# Patient Record
Sex: Female | Born: 1985 | Race: White | Hispanic: No | Marital: Single | Smoking: Current every day smoker
Health system: Southern US, Community
[De-identification: ages and names within clinical notes are randomized; demographics above are authoritative.]

## PROBLEM LIST (undated history)

## (undated) DIAGNOSIS — F909 Attention-deficit hyperactivity disorder, unspecified type: Secondary | ICD-10-CM

## (undated) DIAGNOSIS — G43909 Migraine, unspecified, not intractable, without status migrainosus: Secondary | ICD-10-CM

## (undated) DIAGNOSIS — Z87440 Personal history of urinary (tract) infections: Secondary | ICD-10-CM

## (undated) DIAGNOSIS — D649 Anemia, unspecified: Secondary | ICD-10-CM

## (undated) DIAGNOSIS — M26629 Arthralgia of temporomandibular joint, unspecified side: Secondary | ICD-10-CM

## (undated) DIAGNOSIS — T148XXA Other injury of unspecified body region, initial encounter: Secondary | ICD-10-CM

## (undated) DIAGNOSIS — S51819A Laceration without foreign body of unspecified forearm, initial encounter: Secondary | ICD-10-CM

## (undated) DIAGNOSIS — Z87898 Personal history of other specified conditions: Secondary | ICD-10-CM

## (undated) DIAGNOSIS — S56929A Laceration of unspecified muscles, fascia and tendons at forearm level, unspecified arm, initial encounter: Secondary | ICD-10-CM

## (undated) DIAGNOSIS — A749 Chlamydial infection, unspecified: Secondary | ICD-10-CM

## (undated) DIAGNOSIS — F988 Other specified behavioral and emotional disorders with onset usually occurring in childhood and adolescence: Secondary | ICD-10-CM

## (undated) DIAGNOSIS — R238 Other skin changes: Secondary | ICD-10-CM

## (undated) DIAGNOSIS — R233 Spontaneous ecchymoses: Secondary | ICD-10-CM

---
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
## 2007-09-16 ENCOUNTER — Emergency Department (HOSPITAL_COMMUNITY): Admission: EM | Admit: 2007-09-16 | Discharge: 2007-09-16 | Payer: Self-pay | Admitting: Emergency Medicine

## 2009-01-13 ENCOUNTER — Inpatient Hospital Stay (HOSPITAL_COMMUNITY): Admission: AD | Admit: 2009-01-13 | Discharge: 2009-01-14 | Payer: Self-pay | Admitting: Obstetrics and Gynecology

## 2009-01-23 ENCOUNTER — Inpatient Hospital Stay (HOSPITAL_COMMUNITY): Admission: AD | Admit: 2009-01-23 | Discharge: 2009-01-23 | Payer: Self-pay | Admitting: Obstetrics & Gynecology

## 2009-02-05 ENCOUNTER — Inpatient Hospital Stay (HOSPITAL_COMMUNITY): Admission: AD | Admit: 2009-02-05 | Discharge: 2009-02-09 | Payer: Self-pay | Admitting: Obstetrics and Gynecology

## 2009-06-25 ENCOUNTER — Emergency Department (HOSPITAL_COMMUNITY): Admission: EM | Admit: 2009-06-25 | Discharge: 2009-06-25 | Payer: Self-pay | Admitting: Emergency Medicine

## 2009-11-06 ENCOUNTER — Emergency Department (HOSPITAL_COMMUNITY): Admission: EM | Admit: 2009-11-06 | Discharge: 2009-11-07 | Payer: Self-pay | Admitting: Emergency Medicine

## 2009-12-17 ENCOUNTER — Emergency Department (HOSPITAL_COMMUNITY): Admission: EM | Admit: 2009-12-17 | Discharge: 2009-12-18 | Payer: Self-pay | Admitting: Emergency Medicine

## 2010-04-28 ENCOUNTER — Encounter: Payer: Self-pay | Admitting: Obstetrics and Gynecology

## 2010-05-24 ENCOUNTER — Emergency Department (INDEPENDENT_AMBULATORY_CARE_PROVIDER_SITE_OTHER): Payer: No Typology Code available for payment source

## 2010-05-24 ENCOUNTER — Emergency Department (HOSPITAL_BASED_OUTPATIENT_CLINIC_OR_DEPARTMENT_OTHER)
Admission: EM | Admit: 2010-05-24 | Discharge: 2010-05-24 | Disposition: A | Discharge status: Home / Self Care | Payer: No Typology Code available for payment source | Attending: Emergency Medicine | Admitting: Emergency Medicine

## 2010-05-24 DIAGNOSIS — M542 Cervicalgia: Secondary | ICD-10-CM

## 2010-05-24 DIAGNOSIS — S139XXA Sprain of joints and ligaments of unspecified parts of neck, initial encounter: Secondary | ICD-10-CM | POA: Insufficient documentation

## 2010-05-24 DIAGNOSIS — M549 Dorsalgia, unspecified: Secondary | ICD-10-CM

## 2010-05-24 DIAGNOSIS — Y92410 Unspecified street and highway as the place of occurrence of the external cause: Secondary | ICD-10-CM | POA: Insufficient documentation

## 2010-05-24 DIAGNOSIS — F17200 Nicotine dependence, unspecified, uncomplicated: Secondary | ICD-10-CM | POA: Insufficient documentation

## 2010-05-27 ENCOUNTER — Emergency Department (HOSPITAL_BASED_OUTPATIENT_CLINIC_OR_DEPARTMENT_OTHER)
Admission: EM | Admit: 2010-05-27 | Discharge: 2010-05-27 | Disposition: A | Discharge status: Home / Self Care | Payer: No Typology Code available for payment source | Attending: Emergency Medicine | Admitting: Emergency Medicine

## 2010-05-27 DIAGNOSIS — Y92410 Unspecified street and highway as the place of occurrence of the external cause: Secondary | ICD-10-CM | POA: Insufficient documentation

## 2010-05-27 DIAGNOSIS — M549 Dorsalgia, unspecified: Secondary | ICD-10-CM | POA: Insufficient documentation

## 2010-06-18 ENCOUNTER — Emergency Department (HOSPITAL_BASED_OUTPATIENT_CLINIC_OR_DEPARTMENT_OTHER)
Admission: EM | Admit: 2010-06-18 | Discharge: 2010-06-18 | Disposition: A | Discharge status: Home / Self Care | Payer: No Typology Code available for payment source | Attending: Emergency Medicine | Admitting: Emergency Medicine

## 2010-06-18 DIAGNOSIS — M542 Cervicalgia: Secondary | ICD-10-CM | POA: Insufficient documentation

## 2010-06-18 DIAGNOSIS — M549 Dorsalgia, unspecified: Secondary | ICD-10-CM | POA: Insufficient documentation

## 2010-06-18 DIAGNOSIS — F17200 Nicotine dependence, unspecified, uncomplicated: Secondary | ICD-10-CM | POA: Insufficient documentation

## 2010-06-20 LAB — URINALYSIS, ROUTINE W REFLEX MICROSCOPIC
Ketones, ur: NEGATIVE mg/dL
Nitrite: NEGATIVE
Protein, ur: NEGATIVE mg/dL

## 2010-06-20 LAB — POCT I-STAT, CHEM 8
Calcium, Ion: 1.19 mmol/L (ref 1.12–1.32)
HCT: 46 % (ref 36.0–46.0)
Hemoglobin: 15.6 g/dL — ABNORMAL HIGH (ref 12.0–15.0)
TCO2: 29 mmol/L (ref 0–100)

## 2010-06-21 LAB — URINALYSIS, ROUTINE W REFLEX MICROSCOPIC
Bilirubin Urine: NEGATIVE
Hgb urine dipstick: NEGATIVE
Protein, ur: NEGATIVE mg/dL
Specific Gravity, Urine: 1.013 (ref 1.005–1.030)
Urobilinogen, UA: 0.2 mg/dL (ref 0.0–1.0)

## 2010-06-21 LAB — POCT I-STAT, CHEM 8
Creatinine, Ser: 0.7 mg/dL (ref 0.4–1.2)
Glucose, Bld: 96 mg/dL (ref 70–99)
Hemoglobin: 11.9 g/dL — ABNORMAL LOW (ref 12.0–15.0)
Potassium: 3.9 mEq/L (ref 3.5–5.1)

## 2010-07-10 LAB — CBC
HCT: 27.7 % — ABNORMAL LOW (ref 36.0–46.0)
MCHC: 33.9 g/dL (ref 30.0–36.0)
MCHC: 34 g/dL (ref 30.0–36.0)
MCV: 92.3 fL (ref 78.0–100.0)
MCV: 93.5 fL (ref 78.0–100.0)
Platelets: 224 10*3/uL (ref 150–400)
RBC: 3.69 MIL/uL — ABNORMAL LOW (ref 3.87–5.11)
RDW: 13.6 % (ref 11.5–15.5)

## 2010-07-10 LAB — RPR: RPR Ser Ql: NONREACTIVE

## 2010-08-08 ENCOUNTER — Emergency Department (HOSPITAL_COMMUNITY)
Admission: EM | Admit: 2010-08-08 | Discharge: 2010-08-08 | Disposition: A | Discharge status: Home / Self Care | Payer: Medicaid Other | Attending: Emergency Medicine | Admitting: Emergency Medicine

## 2010-08-08 ENCOUNTER — Emergency Department (HOSPITAL_COMMUNITY): Payer: Medicaid Other

## 2010-08-08 DIAGNOSIS — M545 Low back pain, unspecified: Secondary | ICD-10-CM | POA: Insufficient documentation

## 2010-08-08 DIAGNOSIS — R000 Tachycardia, unspecified: Secondary | ICD-10-CM | POA: Insufficient documentation

## 2010-08-08 DIAGNOSIS — M546 Pain in thoracic spine: Secondary | ICD-10-CM | POA: Insufficient documentation

## 2010-08-08 DIAGNOSIS — S8010XA Contusion of unspecified lower leg, initial encounter: Secondary | ICD-10-CM | POA: Insufficient documentation

## 2010-08-08 DIAGNOSIS — M542 Cervicalgia: Secondary | ICD-10-CM | POA: Insufficient documentation

## 2010-08-08 DIAGNOSIS — IMO0002 Reserved for concepts with insufficient information to code with codable children: Secondary | ICD-10-CM | POA: Insufficient documentation

## 2010-08-11 ENCOUNTER — Inpatient Hospital Stay (INDEPENDENT_AMBULATORY_CARE_PROVIDER_SITE_OTHER)
Admission: RE | Admit: 2010-08-11 | Discharge: 2010-08-11 | Disposition: A | Discharge status: Home / Self Care | Payer: No Typology Code available for payment source | Source: Ambulatory Visit | Attending: Family Medicine | Admitting: Family Medicine

## 2010-08-11 DIAGNOSIS — IMO0001 Reserved for inherently not codable concepts without codable children: Secondary | ICD-10-CM

## 2012-12-31 ENCOUNTER — Encounter (HOSPITAL_BASED_OUTPATIENT_CLINIC_OR_DEPARTMENT_OTHER): Payer: Self-pay | Admitting: *Deleted

## 2012-12-31 ENCOUNTER — Emergency Department (HOSPITAL_BASED_OUTPATIENT_CLINIC_OR_DEPARTMENT_OTHER)
Admission: EM | Admit: 2012-12-31 | Discharge: 2013-01-01 | Disposition: A | Discharge status: Home / Self Care | Payer: Medicaid Other | Attending: Emergency Medicine | Admitting: Emergency Medicine

## 2012-12-31 DIAGNOSIS — Z79899 Other long term (current) drug therapy: Secondary | ICD-10-CM | POA: Insufficient documentation

## 2012-12-31 DIAGNOSIS — Z3202 Encounter for pregnancy test, result negative: Secondary | ICD-10-CM | POA: Insufficient documentation

## 2012-12-31 DIAGNOSIS — F17200 Nicotine dependence, unspecified, uncomplicated: Secondary | ICD-10-CM | POA: Insufficient documentation

## 2012-12-31 DIAGNOSIS — N12 Tubulo-interstitial nephritis, not specified as acute or chronic: Secondary | ICD-10-CM | POA: Insufficient documentation

## 2012-12-31 DIAGNOSIS — R109 Unspecified abdominal pain: Secondary | ICD-10-CM | POA: Insufficient documentation

## 2012-12-31 LAB — URINALYSIS, ROUTINE W REFLEX MICROSCOPIC
Ketones, ur: 40 mg/dL — AB
Protein, ur: 100 mg/dL — AB
Urobilinogen, UA: 1 mg/dL (ref 0.0–1.0)

## 2012-12-31 LAB — WET PREP, GENITAL

## 2012-12-31 MED ORDER — PHENAZOPYRIDINE HCL 100 MG PO TABS
95.0000 mg | ORAL_TABLET | Freq: Once | ORAL | Status: AC
  Administered 2012-12-31: 100 mg via ORAL
  Filled 2012-12-31: qty 1

## 2012-12-31 MED ORDER — OXYCODONE-ACETAMINOPHEN 5-325 MG PO TABS
1.0000 | ORAL_TABLET | Freq: Once | ORAL | Status: AC
  Administered 2012-12-31: 1 via ORAL
  Filled 2012-12-31: qty 1

## 2012-12-31 NOTE — ED Provider Notes (Signed)
CSN: [id number]     Arrival date & time 12/31/12  2215 History   First MD Initiated Contact with Patient 12/31/12 2310     Chief Complaint  Patient presents with  . Dysuria   (Consider location/radiation/quality/duration/timing/severity/associated sxs/prior Treatment) Patient is a 27 y.o. female presenting with abdominal pain.  Abdominal Pain Pain location:  Suprapubic Pain quality: pressure   Pain radiates to:  Does not radiate Pain severity:  Severe Onset quality:  Gradual Duration:  3 days Timing:  Constant Progression:  Worsening Chronicity:  New Context comment:  Evaluated at urgent care and treated with Macrobid, Pyridium for urinary tract infection. Due to continued pain, was started on Keflex today. Relieved by:  Nothing Ineffective treatments: pyridium, Macrobid,  2 ddoses of Keflex. Associated symptoms: dysuria, hematuria and vaginal discharge   Associated symptoms: no chest pain, no constipation, no cough, no diarrhea, no fever, no nausea, no shortness of breath, no vaginal bleeding and no vomiting     History reviewed. No pertinent past medical history. History reviewed. No pertinent past surgical history. No family history on file. History  Substance Use Topics  . Smoking status: Current Every Day Smoker  . Smokeless tobacco: Not on file  . Alcohol Use: No   OB History   Grav Para Term Preterm Abortions TAB SAB Ect Mult Living                 Review of Systems  Constitutional: Negative for fever.  HENT: Negative for congestion.   Respiratory: Negative for cough and shortness of breath.   Cardiovascular: Negative for chest pain.  Gastrointestinal: Positive for abdominal pain. Negative for nausea, vomiting, diarrhea and constipation.  Genitourinary: Positive for dysuria, hematuria and vaginal discharge. Negative for vaginal bleeding and menstrual problem (last menstrual period last week).  All other systems reviewed and are negative.    Allergies  Sulfa  antibiotics  Home Medications   Current Outpatient Rx  Name  Route  Sig  Dispense  Refill  . cephALEXin (KEFLEX) 500 MG capsule   Oral   Take 500 mg by mouth 4 (four) times daily.         Marland Kitchen escitalopram (LEXAPRO) 20 MG tablet   Oral   Take 20 mg by mouth daily.          BP 100/76  Pulse 103  Temp(Src) 98.9 F (37.2 C) (Oral)  Resp 18  Ht 5\' 6"  (1.676 m)  Wt 128 lb (58.06 kg)  BMI 20.67 kg/m2  SpO2 100%  LMP 12/24/2012 Physical Exam  Nursing note and vitals reviewed. Constitutional: She is oriented to person, place, and time. She appears well-developed and well-nourished. No distress.  HENT:  Head: Normocephalic and atraumatic.  Mouth/Throat: Oropharynx is clear and moist.  Eyes: Conjunctivae are normal. Pupils are equal, round, and reactive to light. No scleral icterus.  Neck: Neck supple.  Cardiovascular: Normal rate, regular rhythm, normal heart sounds and intact distal pulses.   No murmur heard. Pulmonary/Chest: Effort normal and breath sounds normal. No stridor. No respiratory distress. She has no rales.  Abdominal: Soft. Bowel sounds are normal. She exhibits no distension. There is tenderness in the right lower quadrant, suprapubic area and left lower quadrant. There is guarding. There is no rigidity, no rebound and no CVA tenderness.  Genitourinary:    There is no tenderness on the right labia. There is no tenderness on the left labia. Uterus is tender. Cervix exhibits motion tenderness and discharge. Cervix exhibits no friability. Right  adnexum displays tenderness. Right adnexum displays no mass. Left adnexum displays tenderness. Left adnexum displays no mass. Vaginal discharge (Large amount of thick, curdy-like, dark gray) found.  Nonfocal, severe pelvic tenderness  Musculoskeletal: Normal range of motion.  Neurological: She is alert and oriented to person, place, and time.  Skin: Skin is warm and dry. No rash noted.  Psychiatric: She has a normal mood and  affect. Her behavior is normal.    ED Course  Procedures (including critical care time) Labs Review Labs Reviewed  WET PREP, GENITAL - Abnormal; Notable for the following:    Clue Cells Wet Prep HPF POC FEW (*)    WBC, Wet Prep HPF POC FEW (*)    All other components within normal limits  URINALYSIS, ROUTINE W REFLEX MICROSCOPIC - Abnormal; Notable for the following:    Color, Urine RED (*)    APPearance TURBID (*)    Hgb urine dipstick LARGE (*)    Bilirubin Urine MODERATE (*)    Ketones, ur 40 (*)    Protein, ur 100 (*)    Nitrite POSITIVE (*)    Leukocytes, UA LARGE (*)    All other components within normal limits  URINE MICROSCOPIC-ADD ON - Abnormal; Notable for the following:    Squamous Epithelial / LPF FEW (*)    Bacteria, UA FEW (*)    All other components within normal limits  COMPREHENSIVE METABOLIC PANEL - Abnormal; Notable for the following:    Glucose, Bld 100 (*)    GFR calc non Af Amer 78 (*)    GFR calc Af Amer 90 (*)    All other components within normal limits  URINE CULTURE  GC/CHLAMYDIA PROBE AMP  PREGNANCY, URINE  CBC WITH DIFFERENTIAL  LIPASE, BLOOD  CG4 I-STAT (LACTIC ACID)   Imaging Review Ct Abdomen Pelvis W Contrast  01/01/2013   CLINICAL DATA:  Generalized abdominal and low back pain. Dysuria for 3 days.  EXAM: CT ABDOMEN AND PELVIS WITH CONTRAST  TECHNIQUE: Multidetector CT imaging of the abdomen and pelvis was performed using the standard protocol following bolus administration of intravenous contrast.  CONTRAST:  OMNIPAQUE IOHEXOL 300 MG/ML  SOLN  COMPARISON:  None.  FINDINGS: Lung bases: Clear lung bases. Normal heart size without pericardial or pleural effusion.  Abdomen/Pelvis: Normal liver, spleen, stomach, pancreas, gallbladder, biliary tract, adrenal glands. Suspect ill definition of cortico-medullary differentiation within both kidneys. Example image 35/series 2. No retroperitoneal or retrocrural adenopathy. Colonic stool burden  suggests constipation. Normal terminal ileum and appendix. Normal small bowel without abdominal ascites. No pelvic adenopathy. Normal urinary bladder and uterus, without adnexal mass or significant free pelvic fluid.  Soft tissue fullness about the right side of the perineum/labia. 3.3 x 3.9 cm on image 88/series 2  Bones/Musculoskeletal: No acute osseous abnormality.  IMPRESSION: 1.  Possible constipation. 2. 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 Cannot exclude pyelonephritis. Correlate with urinalysis. 3. Soft tissue fullness within the right side of the labia/perineum. Indeterminate. Recommend PHYSICAL EXAMINATION correlation. Considerations include Bartholin's gland cyst.   Electronically Signed   By: Jeronimo Greaves   On: 01/01/2013 02:52  All radiology studies independently viewed by me.     MDM   1. Pyelonephritis    27 year old female presenting with dysuria and lower abdominal pain. Her urinalysis was consistent with urinary tract infection. However, her pain and abdominal tenderness was felt to be out of proportion to simple cystitis. Pelvic exam showed large amount of gray-black discharge and diffuse pelvic  tenderness. CT was obtained and showed no evidence of ovarian or uterine pathology. It did show signs of pyelonephritis and what was felt to be a Bartholin's gland cyst. She had 3 days worth of symptoms, no leukocytosis, no lactic acidosis. Ovarian torsion and TOA were considered, but given the rest of her clinical picture they were felt to be very unlikely, especially with duration of symptoms and negative CT and labs.    As regards her pyelonephritis, she was given IV Rocephin.  Her blood pressures were somewhat low. However, she was not tachycardic or lightheaded. Even so, admission was advised. She adamantly refused this. She was given strict return precautions (including fevers, worsening pain, inability to tolerate by mouth's, or any other concerning symptoms) and  verbalized her understanding. She will be treated with 2 weeks of by mouth Keflex. She already has 10 days worth, so she was provided with 4 additional days worth. Multiple doses of by mouth and IV narcotics have improved pain somewhat, but not totally.  As regards her pelvic pain and vaginal discharge, she will be treated for PID. She already received a dose of Rocephin. She will be discharged with two-week course of doxycycline. She will followup closely with her gynecologist.  As regards her Bartholin's gland cyst, she does not show physical exam evidence of abscess. She reports intermittent symptoms, but has not talked to her gynecologist about these. Advised to discuss with her gynecologist.    Candyce Churn, MD 01/01/13 [phone]

## 2012-12-31 NOTE — ED Notes (Signed)
MD at bedside. 

## 2012-12-31 NOTE — ED Notes (Addendum)
Pt is being treated for a UTI. She sts that she was an antibiotic "that began with an M" but it wasn't helping so she was changed to Keflex today. Pt sts that she is having severe lower back pain and is unable to urinate. Her PMD did not perform a pelvic exam.

## 2012-12-31 NOTE — ED Notes (Signed)
Pt reports taking pyridium and it not helping.

## 2013-01-01 ENCOUNTER — Emergency Department (HOSPITAL_BASED_OUTPATIENT_CLINIC_OR_DEPARTMENT_OTHER): Payer: Medicaid Other

## 2013-01-01 LAB — CBC WITH DIFFERENTIAL/PLATELET
Basophils Absolute: 0 10*3/uL (ref 0.0–0.1)
Eosinophils Absolute: 0 10*3/uL (ref 0.0–0.7)
Eosinophils Relative: 0 % (ref 0–5)
HCT: 38.6 % (ref 36.0–46.0)
Lymphocytes Relative: 14 % (ref 12–46)
Lymphs Abs: 1.3 10*3/uL (ref 0.7–4.0)
MCH: 30.5 pg (ref 26.0–34.0)
MCV: 92.8 fL (ref 78.0–100.0)
Monocytes Absolute: 0.7 10*3/uL (ref 0.1–1.0)
Monocytes Relative: 8 % (ref 3–12)
Platelets: 295 10*3/uL (ref 150–400)
RBC: 4.16 MIL/uL (ref 3.87–5.11)
RDW: 12.3 % (ref 11.5–15.5)

## 2013-01-01 LAB — COMPREHENSIVE METABOLIC PANEL
ALT: 13 U/L (ref 0–35)
Calcium: 10.1 mg/dL (ref 8.4–10.5)
Creatinine, Ser: 1 mg/dL (ref 0.50–1.10)
GFR calc Af Amer: 90 mL/min — ABNORMAL LOW (ref >90)
Glucose, Bld: 100 mg/dL — ABNORMAL HIGH (ref 70–99)
Sodium: 138 mEq/L (ref 135–145)
Total Protein: 7.3 g/dL (ref 6.0–8.3)

## 2013-01-01 LAB — CG4 I-STAT (LACTIC ACID): Lactic Acid, Venous: 0.84 mmol/L (ref 0.5–2.2)

## 2013-01-01 MED ORDER — IOHEXOL 300 MG/ML  SOLN
100.0000 mL | Freq: Once | INTRAMUSCULAR | Status: AC | PRN
  Administered 2013-01-01: 100 mL via INTRAVENOUS

## 2013-01-01 MED ORDER — CEPHALEXIN 500 MG PO CAPS: 500.0000 mg | ORAL_CAPSULE | Freq: Four times a day (QID) | ORAL | Status: DC

## 2013-01-01 MED ORDER — SODIUM CHLORIDE 0.9 % IV BOLUS (SEPSIS)
1000.0000 mL | Freq: Once | INTRAVENOUS | Status: AC
  Administered 2013-01-01: 1000 mL via INTRAVENOUS

## 2013-01-01 MED ORDER — DEXTROSE 5 % IV SOLN
1.0000 g | Freq: Once | INTRAVENOUS | Status: AC
  Administered 2013-01-01: 1 g via INTRAVENOUS

## 2013-01-01 MED ORDER — MORPHINE SULFATE 4 MG/ML IJ SOLN
4.0000 mg | Freq: Once | INTRAMUSCULAR | Status: AC
  Administered 2013-01-01: 4 mg via INTRAVENOUS

## 2013-01-01 MED ORDER — ONDANSETRON HCL 4 MG/2ML IJ SOLN
4.0000 mg | Freq: Once | INTRAMUSCULAR | Status: AC
  Administered 2013-01-01: 4 mg via INTRAVENOUS
  Filled 2013-01-01: qty 2

## 2013-01-01 MED ORDER — CEFTRIAXONE SODIUM 1 G IJ SOLR
INTRAMUSCULAR | Status: AC
  Filled 2013-01-01: qty 10

## 2013-01-01 MED ORDER — ONDANSETRON HCL 4 MG PO TABS: 4.0000 mg | ORAL_TABLET | Freq: Four times a day (QID) | ORAL | Status: DC

## 2013-01-01 MED ORDER — OXYCODONE-ACETAMINOPHEN 5-325 MG PO TABS
1.0000 | ORAL_TABLET | Freq: Once | ORAL | Status: AC
  Administered 2013-01-01: 1 via ORAL
  Filled 2013-01-01: qty 1

## 2013-01-01 MED ORDER — FENTANYL CITRATE 0.05 MG/ML IJ SOLN
50.0000 ug | Freq: Once | INTRAMUSCULAR | Status: AC
  Administered 2013-01-01: 50 ug via INTRAVENOUS
  Filled 2013-01-01: qty 2

## 2013-01-01 MED ORDER — DOXYCYCLINE HYCLATE 100 MG PO TABS
100.0000 mg | ORAL_TABLET | Freq: Once | ORAL | Status: AC
  Administered 2013-01-01: 100 mg via ORAL
  Filled 2013-01-01: qty 1

## 2013-01-01 MED ORDER — IOHEXOL 300 MG/ML  SOLN
50.0000 mL | Freq: Once | INTRAMUSCULAR | Status: AC | PRN
  Administered 2013-01-01: 50 mL via ORAL

## 2013-01-01 MED ORDER — MORPHINE SULFATE 4 MG/ML IJ SOLN
INTRAMUSCULAR | Status: AC
  Filled 2013-01-01: qty 1

## 2013-01-01 MED ORDER — OXYCODONE-ACETAMINOPHEN 5-325 MG PO TABS: 1.0000 | ORAL_TABLET | ORAL | Status: DC | PRN

## 2013-01-01 MED ORDER — MORPHINE SULFATE 4 MG/ML IJ SOLN
4.0000 mg | Freq: Once | INTRAMUSCULAR | Status: DC
  Filled 2013-01-01: qty 1

## 2013-01-01 MED ORDER — DOXYCYCLINE HYCLATE 100 MG PO CAPS: 100.0000 mg | ORAL_CAPSULE | Freq: Two times a day (BID) | ORAL | Status: DC

## 2013-01-01 NOTE — ED Notes (Signed)
Patient hit call light, notified me that she consumed all the contrast she was given.

## 2013-01-01 NOTE — ED Notes (Signed)
Pt requesting going home.  MD aware.

## 2013-01-01 NOTE — ED Notes (Signed)
Pt requesting more pain medication. MD made aware

## 2013-01-01 NOTE — ED Notes (Signed)
MD went to speak with patient.  Pt is refusing admission.  MD verbalized risk of not being admitted and signs and symptoms to look for to immediately come back.  Pt verbalized understanding.

## 2013-01-01 NOTE — ED Notes (Signed)
Friend at bedside speaking with patient.

## 2013-01-02 ENCOUNTER — Encounter (HOSPITAL_BASED_OUTPATIENT_CLINIC_OR_DEPARTMENT_OTHER): Payer: Self-pay | Admitting: *Deleted

## 2013-01-02 ENCOUNTER — Emergency Department (HOSPITAL_BASED_OUTPATIENT_CLINIC_OR_DEPARTMENT_OTHER)
Admission: EM | Admit: 2013-01-02 | Discharge: 2013-01-02 | Disposition: A | Discharge status: Home / Self Care | Payer: Medicaid Other | Attending: Emergency Medicine | Admitting: Emergency Medicine

## 2013-01-02 DIAGNOSIS — R630 Anorexia: Secondary | ICD-10-CM | POA: Insufficient documentation

## 2013-01-02 DIAGNOSIS — R110 Nausea: Secondary | ICD-10-CM | POA: Insufficient documentation

## 2013-01-02 DIAGNOSIS — R509 Fever, unspecified: Secondary | ICD-10-CM | POA: Insufficient documentation

## 2013-01-02 DIAGNOSIS — Z792 Long term (current) use of antibiotics: Secondary | ICD-10-CM | POA: Insufficient documentation

## 2013-01-02 DIAGNOSIS — Z79899 Other long term (current) drug therapy: Secondary | ICD-10-CM | POA: Insufficient documentation

## 2013-01-02 DIAGNOSIS — N12 Tubulo-interstitial nephritis, not specified as acute or chronic: Secondary | ICD-10-CM | POA: Insufficient documentation

## 2013-01-02 DIAGNOSIS — Z3202 Encounter for pregnancy test, result negative: Secondary | ICD-10-CM | POA: Insufficient documentation

## 2013-01-02 LAB — URINALYSIS, ROUTINE W REFLEX MICROSCOPIC
Glucose, UA: NEGATIVE mg/dL
Leukocytes, UA: NEGATIVE
Nitrite: NEGATIVE
Specific Gravity, Urine: 1.012 (ref 1.005–1.030)
pH: 7 (ref 5.0–8.0)

## 2013-01-02 LAB — PREGNANCY, URINE: Preg Test, Ur: NEGATIVE

## 2013-01-02 LAB — CBC WITH DIFFERENTIAL/PLATELET
Basophils Absolute: 0 10*3/uL (ref 0.0–0.1)
Hemoglobin: 13.4 g/dL (ref 12.0–15.0)
Lymphocytes Relative: 12 % (ref 12–46)
Lymphs Abs: 1.1 10*3/uL (ref 0.7–4.0)
MCV: 93.1 fL (ref 78.0–100.0)
Monocytes Relative: 5 % (ref 3–12)
Neutro Abs: 7.3 10*3/uL (ref 1.7–7.7)
Neutrophils Relative %: 82 % — ABNORMAL HIGH (ref 43–77)
Platelets: 284 10*3/uL (ref 150–400)
RBC: 4.32 MIL/uL (ref 3.87–5.11)
WBC: 8.9 10*3/uL (ref 4.0–10.5)

## 2013-01-02 LAB — COMPREHENSIVE METABOLIC PANEL
AST: 20 U/L (ref 0–37)
Albumin: 3.9 g/dL (ref 3.5–5.2)
Alkaline Phosphatase: 73 U/L (ref 39–117)
BUN: 12 mg/dL (ref 6–23)
Calcium: 9.3 mg/dL (ref 8.4–10.5)
Chloride: 104 mEq/L (ref 96–112)
Potassium: 4 mEq/L (ref 3.5–5.1)
Total Bilirubin: 0.5 mg/dL (ref 0.3–1.2)
Total Protein: 7.3 g/dL (ref 6.0–8.3)

## 2013-01-02 MED ORDER — ONDANSETRON HCL 4 MG/2ML IJ SOLN
4.0000 mg | Freq: Once | INTRAMUSCULAR | Status: AC
  Administered 2013-01-02: 4 mg via INTRAVENOUS
  Filled 2013-01-02: qty 2

## 2013-01-02 MED ORDER — PROMETHAZINE HCL 25 MG PO TABS: 25.0000 mg | ORAL_TABLET | Freq: Four times a day (QID) | ORAL | Status: DC | PRN

## 2013-01-02 MED ORDER — HYDROMORPHONE HCL PF 1 MG/ML IJ SOLN
1.0000 mg | Freq: Once | INTRAMUSCULAR | Status: AC
  Administered 2013-01-02: 1 mg via INTRAVENOUS
  Filled 2013-01-02: qty 1

## 2013-01-02 MED ORDER — SODIUM CHLORIDE 0.9 % IV BOLUS (SEPSIS)
1000.0000 mL | Freq: Once | INTRAVENOUS | Status: AC
  Administered 2013-01-02: 1000 mL via INTRAVENOUS

## 2013-01-02 MED ORDER — HYDROCODONE-ACETAMINOPHEN 5-325 MG PO TABS: ORAL_TABLET | ORAL | Status: DC

## 2013-01-02 NOTE — ED Provider Notes (Signed)
Medical screening examination/treatment/procedure(s) were performed by non-physician practitioner and as supervising physician I was immediately available for consultation/collaboration.   Brittinie Wherley, MD 01/02/13 2347 

## 2013-01-02 NOTE — ED Notes (Signed)
Patient was here a few days ago and was advised to be admitted, but she decided to go home instead. Back today for pain and no improvwment of symptoms

## 2013-01-02 NOTE — ED Provider Notes (Signed)
CSN: [id number]     Arrival date & time 01/02/13  1705 History   First MD Initiated Contact with Patient 01/02/13 1709     Chief Complaint  Patient presents with  . Urinary Tract Infection   (Consider location/radiation/quality/duration/timing/severity/associated sxs/prior Treatment) HPI Comments: Patient presents with persistence of dysuria, lower abdominal pain, back pain, nausea without improvement on Keflex and doxycycline. Patient was seen in emergency department 2 days ago and had a workup concerning for pyelonephritis. She also was treated for PID. Patient reports no improvement of symptoms with antibiotics. She has no improvement of pain with Percocet. Subjective fever and chills. She has decreased appetite and fluid intake but she has not been vomiting. She refused admission 2 days ago, however today is willing to be admitted for treatment. Onset of symptoms gradual. Course is persistent. Nothing makes symptoms better.  Patient is a 27 y.o. female presenting with urinary tract infection. The history is provided by the patient and medical records.  Urinary Tract Infection Associated symptoms include chills, a fever and nausea. Pertinent negatives include no abdominal pain, chest pain, coughing, headaches, myalgias, rash, sore throat or vomiting.    History reviewed. No pertinent past medical history. History reviewed. No pertinent past surgical history. No family history on file. History  Substance Use Topics  . Smoking status: Current Every Day Smoker  . Smokeless tobacco: Not on file  . Alcohol Use: No   OB History   Grav Para Term Preterm Abortions TAB SAB Ect Mult Living                 Review of Systems  Constitutional: Positive for fever and chills.  HENT: Negative for sore throat and rhinorrhea.   Eyes: Negative for redness.  Respiratory: Negative for cough.   Cardiovascular: Negative for chest pain.  Gastrointestinal: Positive for nausea. Negative for vomiting,  abdominal pain and diarrhea.  Genitourinary: Positive for dysuria, frequency and flank pain. Negative for hematuria and vaginal bleeding.  Musculoskeletal: Negative for myalgias.  Skin: Negative for rash.  Neurological: Negative for headaches.    Allergies  Sulfa antibiotics  Home Medications   Current Outpatient Rx  Name  Route  Sig  Dispense  Refill  . cephALEXin (KEFLEX) 500 MG capsule   Oral   Take 500 mg by mouth 4 (four) times daily.         . cephALEXin (KEFLEX) 500 MG capsule   Oral   Take 1 capsule (500 mg total) by mouth 4 (four) times daily.   16 capsule   0   . doxycycline (VIBRAMYCIN) 100 MG capsule   Oral   Take 1 capsule (100 mg total) by mouth 2 (two) times daily.   28 capsule   0   . escitalopram (LEXAPRO) 20 MG tablet   Oral   Take 20 mg by mouth daily.         . ondansetron (ZOFRAN) 4 MG tablet   Oral   Take 1 tablet (4 mg total) by mouth every 6 (six) hours.   12 tablet   0   . oxyCODONE-acetaminophen (PERCOCET/ROXICET) 5-325 MG per tablet   Oral   Take 1 tablet by mouth every 4 (four) hours as needed for pain.   12 tablet   0    BP 118/71  Pulse 94  Temp(Src) 98.6 F (37 C) (Oral)  Resp 16  SpO2 98%  LMP 12/24/2012 Physical Exam  Nursing note and vitals reviewed. Constitutional: She appears well-developed and well-nourished.  Uncomfortable appearing.  HENT:  Head: Normocephalic and atraumatic.  Eyes: Conjunctivae are normal. Right eye exhibits no discharge. Left eye exhibits no discharge.  Neck: Normal range of motion. Neck supple.  Cardiovascular: Normal rate, regular rhythm and normal heart sounds.   Pulmonary/Chest: Effort normal and breath sounds normal. No respiratory distress. She has no wheezes.  Abdominal: Soft. There is tenderness in the suprapubic area. There is CVA tenderness (bilaterally). There is no rigidity, no rebound, no guarding, no tenderness at McBurney's point and negative Murphy's sign.  Neurological: She  is alert.  Skin: Skin is warm and dry.  Psychiatric: She has a normal mood and affect.    ED Course  Procedures (including critical care time) Labs Review Labs Reviewed  CBC WITH DIFFERENTIAL - Abnormal; Notable for the following:    Neutrophils Relative % 82 (*)    All other components within normal limits  URINALYSIS, ROUTINE W REFLEX MICROSCOPIC - Abnormal; Notable for the following:    APPearance CLOUDY (*)    All other components within normal limits  PREGNANCY, URINE  COMPREHENSIVE METABOLIC PANEL   Imaging Review Ct Abdomen Pelvis W Contrast  01/01/2013   CLINICAL DATA:  Generalized abdominal and low back pain. Dysuria for 3 days.  EXAM: CT ABDOMEN AND PELVIS WITH CONTRAST  TECHNIQUE: Multidetector CT imaging of the abdomen and pelvis was performed using the standard protocol following bolus administration of intravenous contrast.  CONTRAST:  OMNIPAQUE IOHEXOL 300 MG/ML  SOLN  COMPARISON:  None.  FINDINGS: Lung bases: Clear lung bases. Normal heart size without pericardial or pleural effusion.  Abdomen/Pelvis: Normal liver, spleen, stomach, pancreas, gallbladder, biliary tract, adrenal glands. Suspect ill definition of cortico-medullary differentiation within both kidneys. Example image 35/series 2. No retroperitoneal or retrocrural adenopathy. Colonic stool burden suggests constipation. Normal terminal ileum and appendix. Normal small bowel without abdominal ascites. No pelvic adenopathy. Normal urinary bladder and uterus, without adnexal mass or significant free pelvic fluid.  Soft tissue fullness about the right side of the perineum/labia. 3.3 x 3.9 cm on image 88/series 2  Bones/Musculoskeletal: No acute osseous abnormality.  IMPRESSION: 1.  Possible constipation. 2. 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 Cannot exclude pyelonephritis. Correlate with urinalysis. 3. Soft tissue fullness within the right side of the labia/perineum. Indeterminate. Recommend  PHYSICAL EXAMINATION correlation. Considerations include Bartholin's gland cyst.   Electronically Signed   By: Jeronimo Greaves   On: 01/01/2013 02:52   5:26 PM Patient seen and examined. Previous medical records reviewed. Work-up initiated. Medications ordered.   Vital signs reviewed and are as follows: Filed Vitals:   01/02/13 1715  BP: 118/71  Pulse: 94  Temp: 98.6 F (37 C)  Resp: 16   Patient informed of improvement in her urine test. She received 2 doses of IV narcotics and antiemetics in emergency department. She notes improvement. She is not vomiting and her nausea is improved. Pain is controlled. Patient wants to go home. She has received 2000 cc normal saline. She appears improved. Abdomen is soft and mildly tender on repeat exam.   The patient was urged to return to the Emergency Department immediately with worsening of current symptoms, worsening abdominal pain, persistent vomiting, blood noted in stools, fever, or any other concerns. The patient verbalized understanding.   Patient counseled on use of narcotic pain medications. Counseled not to combine these medications with others containing tylenol. Urged not to drink alcohol, drive, or perform any other activities that requires focus while taking these medications. The patient  verbalizes understanding and agrees with the plan.    MDM   1. Pyelonephritis    Patient likely with pyelonephritis, improving. Unfortunately her GC/Chlamydia and urine cultures have not yet resulted. Her urine, which appeared grossly infected 2 days ago is now clear. Patient is not having any persistent vomiting or fever. Her main complaint is residual lower back and suprapubic pain. Her symptoms are improved and controlled in emergency department. Patient wants to go home. She appears improved clinically. She appears nontoxic and I do not feel that she would benefit from or requires inpatient hospitalization at this time. Will continue Keflex and  doxycycline.    Renne Crigler, PA-C 01/02/13 [phone]

## 2013-01-03 LAB — URINE CULTURE: Colony Count: NO GROWTH

## 2013-01-04 ENCOUNTER — Telehealth (HOSPITAL_COMMUNITY): Payer: Self-pay | Admitting: *Deleted

## 2013-01-04 NOTE — ED Notes (Signed)
+   Gonorrhea Patient treated with Rocephin and Doxycycline-[facility] letter faxed.

## 2013-01-05 ENCOUNTER — Encounter (HOSPITAL_BASED_OUTPATIENT_CLINIC_OR_DEPARTMENT_OTHER): Payer: Self-pay

## 2013-01-05 ENCOUNTER — Emergency Department (HOSPITAL_BASED_OUTPATIENT_CLINIC_OR_DEPARTMENT_OTHER)
Admission: EM | Admit: 2013-01-05 | Discharge: 2013-01-05 | Disposition: A | Discharge status: Home / Self Care | Payer: Medicaid Other | Attending: Emergency Medicine | Admitting: Emergency Medicine

## 2013-01-05 DIAGNOSIS — F17200 Nicotine dependence, unspecified, uncomplicated: Secondary | ICD-10-CM | POA: Insufficient documentation

## 2013-01-05 DIAGNOSIS — A499 Bacterial infection, unspecified: Secondary | ICD-10-CM | POA: Insufficient documentation

## 2013-01-05 DIAGNOSIS — Z3202 Encounter for pregnancy test, result negative: Secondary | ICD-10-CM | POA: Insufficient documentation

## 2013-01-05 DIAGNOSIS — Z79899 Other long term (current) drug therapy: Secondary | ICD-10-CM | POA: Insufficient documentation

## 2013-01-05 DIAGNOSIS — N760 Acute vaginitis: Secondary | ICD-10-CM | POA: Insufficient documentation

## 2013-01-05 DIAGNOSIS — N730 Acute parametritis and pelvic cellulitis: Secondary | ICD-10-CM

## 2013-01-05 DIAGNOSIS — N750 Cyst of Bartholin's gland: Secondary | ICD-10-CM | POA: Insufficient documentation

## 2013-01-05 DIAGNOSIS — B9689 Other specified bacterial agents as the cause of diseases classified elsewhere: Secondary | ICD-10-CM | POA: Insufficient documentation

## 2013-01-05 DIAGNOSIS — A749 Chlamydial infection, unspecified: Secondary | ICD-10-CM

## 2013-01-05 DIAGNOSIS — N739 Female pelvic inflammatory disease, unspecified: Secondary | ICD-10-CM | POA: Insufficient documentation

## 2013-01-05 HISTORY — DX: Anemia, unspecified: D64.9

## 2013-01-05 LAB — BASIC METABOLIC PANEL
Calcium: 9.8 mg/dL (ref 8.4–10.5)
GFR calc Af Amer: >90 mL/min (ref >90)
GFR calc non Af Amer: >90 mL/min (ref >90)
Potassium: 3.7 mEq/L (ref 3.5–5.1)
Sodium: 140 mEq/L (ref 135–145)

## 2013-01-05 LAB — CBC WITH DIFFERENTIAL/PLATELET
Basophils Absolute: 0 10*3/uL (ref 0.0–0.1)
Basophils Relative: 1 % (ref 0–1)
Eosinophils Absolute: 0.1 10*3/uL (ref 0.0–0.7)
Hemoglobin: 12.6 g/dL (ref 12.0–15.0)
Lymphs Abs: 1.1 10*3/uL (ref 0.7–4.0)
MCH: 31.2 pg (ref 26.0–34.0)
MCHC: 33.5 g/dL (ref 30.0–36.0)
Monocytes Relative: 7 % (ref 3–12)
Neutro Abs: 4.9 10*3/uL (ref 1.7–7.7)
Neutrophils Relative %: 75 % (ref 43–77)
Platelets: 302 10*3/uL (ref 150–400)
RBC: 4.04 MIL/uL (ref 3.87–5.11)
WBC: 6.5 10*3/uL (ref 4.0–10.5)

## 2013-01-05 LAB — WET PREP, GENITAL: Trich, Wet Prep: NONE SEEN

## 2013-01-05 LAB — URINALYSIS, ROUTINE W REFLEX MICROSCOPIC
Leukocytes, UA: NEGATIVE
Nitrite: NEGATIVE
Protein, ur: NEGATIVE mg/dL
Specific Gravity, Urine: 1.016 (ref 1.005–1.030)
Urobilinogen, UA: 0.2 mg/dL (ref 0.0–1.0)

## 2013-01-05 LAB — HIV ANTIBODY (ROUTINE TESTING W REFLEX): HIV: NONREACTIVE

## 2013-01-05 LAB — RPR: RPR Ser Ql: NONREACTIVE

## 2013-01-05 MED ORDER — ONDANSETRON HCL 4 MG/2ML IJ SOLN
4.0000 mg | Freq: Once | INTRAMUSCULAR | Status: AC
  Administered 2013-01-05: 4 mg via INTRAVENOUS
  Filled 2013-01-05: qty 2

## 2013-01-05 MED ORDER — KETOROLAC TROMETHAMINE 30 MG/ML IJ SOLN
30.0000 mg | Freq: Once | INTRAMUSCULAR | Status: AC
  Administered 2013-01-05: 30 mg via INTRAVENOUS
  Filled 2013-01-05: qty 1

## 2013-01-05 MED ORDER — MORPHINE SULFATE 4 MG/ML IJ SOLN
4.0000 mg | Freq: Once | INTRAMUSCULAR | Status: AC
  Administered 2013-01-05: 4 mg via INTRAVENOUS
  Filled 2013-01-05: qty 1

## 2013-01-05 MED ORDER — CEFTRIAXONE SODIUM 250 MG IJ SOLR
250.0000 mg | Freq: Once | INTRAMUSCULAR | Status: AC
  Administered 2013-01-05: 250 mg via INTRAMUSCULAR
  Filled 2013-01-05: qty 250

## 2013-01-05 MED ORDER — SODIUM CHLORIDE 0.9 % IV BOLUS (SEPSIS)
1000.0000 mL | Freq: Once | INTRAVENOUS | Status: AC
  Administered 2013-01-05: 1000 mL via INTRAVENOUS

## 2013-01-05 MED ORDER — AZITHROMYCIN 250 MG PO TABS
1000.0000 mg | ORAL_TABLET | Freq: Once | ORAL | Status: AC
  Administered 2013-01-05: 1000 mg via ORAL
  Filled 2013-01-05: qty 4

## 2013-01-05 MED ORDER — METRONIDAZOLE 500 MG PO TABS: 500.0000 mg | ORAL_TABLET | Freq: Two times a day (BID) | ORAL | Status: DC

## 2013-01-05 MED ORDER — MORPHINE SULFATE 4 MG/ML IJ SOLN
INTRAMUSCULAR | Status: AC
  Filled 2013-01-05: qty 1

## 2013-01-05 MED ORDER — HYDROMORPHONE HCL 2 MG PO TABS: 2.0000 mg | ORAL_TABLET | ORAL | Status: DC | PRN

## 2013-01-05 MED ORDER — LIDOCAINE HCL (PF) 1 % IJ SOLN
INTRAMUSCULAR | Status: AC
  Administered 2013-01-05: 11:00:00
  Filled 2013-01-05: qty 5

## 2013-01-05 NOTE — ED Provider Notes (Signed)
TIME SEEN: 9:55 AM  CHIEF COMPLAINT: Abdominal pain, lower back pain  HPI: Patient is a 27 y.o. female with no significant past medical history who presents to the emergency department with complaints of diffuse lower abdominal pain and lower back pain that have been present for the past week. She was diagnosed with urinary tract infection and was started on Macrobid by her PCP over one week ago. She took several days worth of this medication but when her symptoms became worse, she presented to the emergency department on September 26. She was diagnosed with pyelonephritis based on urine and CT scan and was recommended admission but she wanted to go home.  She was also noted to have cervical motion tenderness on pelvic exam and a Bartholin's cyst. Patient was given a dose of IV Rocephin for her UTI and possible PID and was discharged home on Keflex and doxycycline. Patient returned to the ED on September 28 with continued symptoms. Her urinalysis showed improvement and her labs were unchanged. She again wanted discharge home and was instructed to continue Keflex and doxycycline. At that time her urine culture, GC and chlamydia had not returned. On review of her cultures today, her urine culture has showed no growth. She is positive for chlamydia and negative for gonorrhea.   She states that her pain is increasingly gotten worse in her lower abdomen and back. Her dysuria and hematuria have improved. She has had chills, nausea and diarrhea. She's never had similar symptoms.  She states she is sexually active with  one partner intermittently.  Her last sexual encounter was one week ago. She has had one pregnancy and has one child. He has had a cesarean section. No prior history of sexually transmitted diseases.   ROS: See HPI Constitutional: no fever  Eyes: no drainage  ENT: no runny nose   Cardiovascular:  no chest pain  Resp: no SOB  GI: no vomiting GU:  dysuria Integumentary: no rash  Allergy: no  hives  Musculoskeletal: no leg swelling  Neurological: no slurred speech ROS otherwise negative  PAST MEDICAL HISTORY/PAST SURGICAL HISTORY:   history of prior cesarean section   MEDICATIONS:  Prior to Admission medications   Medication Sig Start Date End Date Taking? Authorizing Provider  cephALEXin (KEFLEX) 500 MG capsule Take 500 mg by mouth 4 (four) times daily.    Historical Provider, MD  cephALEXin (KEFLEX) 500 MG capsule Take 1 capsule (500 mg total) by mouth 4 (four) times daily. 01/01/13   Candyce Churn, MD  doxycycline (VIBRAMYCIN) 100 MG capsule Take 1 capsule (100 mg total) by mouth 2 (two) times daily. 01/01/13   Candyce Churn, MD  escitalopram (LEXAPRO) 20 MG tablet Take 20 mg by mouth daily.    Historical Provider, MD  HYDROcodone-acetaminophen (NORCO/VICODIN) 5-325 MG per tablet Take 1-2 tablets every 6 hours as needed for severe pain 01/02/13   Renne Crigler, PA-C  ondansetron (ZOFRAN) 4 MG tablet Take 1 tablet (4 mg total) by mouth every 6 (six) hours. 01/01/13   Candyce Churn, MD  oxyCODONE-acetaminophen (PERCOCET/ROXICET) 5-325 MG per tablet Take 1 tablet by mouth every 4 (four) hours as needed for pain. 01/01/13   Candyce Churn, MD  promethazine (PHENERGAN) 25 MG tablet Take 1 tablet (25 mg total) by mouth every 6 (six) hours as needed for nausea. 01/02/13   Renne Crigler, PA-C    ALLERGIES:  Allergies  Allergen Reactions  . Sulfa Antibiotics Hives    SOCIAL HISTORY:  History  Substance Use Topics  . Smoking status: Current Every Day Smoker  . Smokeless tobacco: Not on file  . Alcohol Use: No   Denies illicit drug use.   FAMILY HISTORY:  Reviewed and not pertinent.   EXAM: BP 119/90  Pulse 93  Temp(Src) 98.7 F (37.1 C) (Oral)  Resp 20  Ht 5\' 6"  (1.676 m)  Wt 130 lb (58.968 kg)  BMI 20.99 kg/m2  SpO2 98%  LMP 12/24/2012 CONSTITUTIONAL: Alert and oriented and responds appropriately to questions.  Nontoxic, well-hydrated but  appears  uncomfortable, tearful HEAD: Normocephalic EYES: Conjunctivae clear, PERRL ENT: normal nose; no rhinorrhea; moist mucous membranes; pharynx without lesions noted NECK: Supple, no meningismus, no LAD  CARD: RRR; S1 and S2 appreciated; no murmurs, no clicks, no rubs, no gallops RESP: Normal chest excursion without splinting or tachypnea; breath sounds clear and equal bilaterally; no wheezes, no rhonchi, no rales,  ABD/GI: Normal bowel sounds; non-distended; soft, diffuse tenderness across the lower abdomen without rebound or guarding, no pain at McBurney's point, no peritoneal signs GU:  No vaginal bleeding, patient has a thick, gray, copious amount of vaginal discharge, severe cervical motion tenderness, bilateral adnexal tenderness without fullness, patient also has a large fluctuant area to the right of the vaginal introitus consistent with a Bartholin's cyst without surrounding erythema, warmth, induration or drainage. Patient is mildly tender to palpation over this cyst. BACK:  The back appears normal and is non-tender to palpation, there is no CVA tenderness EXT: Normal ROM in all joints; non-tender to palpation; no edema; normal capillary refill; no cyanosis    SKIN: Normal color for age and race; warm NEURO: Moves all extremities equally PSYCH: The patient's mood and manner are appropriate. Grooming and personal hygiene are appropriate.  MEDICAL DECISION MAKING: Patient with  recently diagnosis of pyelonephritis and PID who is not improving with oral antibiotics. She is hemodynamically stable. Will repeat labs, urine and culture, pelvic exam. Have recommended admission for IV antibiotics and pain control given her symptoms are  not improving. PCP is at [facility].  ED PROGRESS: Patient's labs are unremarkable. No leukocytosis. She is still hemodynamically stable. Pain is improved after Toradol and morphine. Urinalysis with culture pending. I have repeated her pelvic exam. Patient has a  significant amount of gray, thick vaginal discharge with cervical motion tenderness and adnexal tenderness. I am concerned for pelvic inflammatory disease that is not improving with oral antibiotics. She has received ceftriaxone 250 mg IM in the ED today as well as Azithromycin 1g po and has been on doxycycline since her visit on September 26. Have recommended that we admit patient to the hospital for IV antibiotics but again patient reports she would like discharge home. Patient also has a right-sided Bartholin cyst without associated signs of infection. Have recommended drainage of the cyst with placement of a Word catheter but she has refused this at this time and states she will follow up with her GYN.   12:42 PM  Pt reports that her pain is still significant she does not want another dose of IV narcotics. She reports that she would rather go home and take oral medication. She reports that she understands she needs admission to the hospital and she has not improving with oral antibiotics. 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 She understands that there are risks with having severe PID without appropriate treatment including infertility, sepsis, tubo-ovarian abscess. She reports that she will  come back to the hospital her symptoms become worse or not improved in 24-48 hours. Have also recommended that she followup with her GYN or go to [facility] as soon as possible. We'll have her finish her dose of Keflex given her urine shows no sign of infection today and her urine culture has been negative. We'll have her continue her doxycycline. We'll discharge him with prescription for Dilaudid and she reports Vicodin and Percocet are not helping with her pain.  Layla Maw Ward, DO 01/05/13 1244

## 2013-01-05 NOTE — ED Notes (Signed)
Pt reports lower back pain, lower abdominal pain, states she was seen recently and treated for UTI.

## 2013-01-06 ENCOUNTER — Emergency Department (HOSPITAL_BASED_OUTPATIENT_CLINIC_OR_DEPARTMENT_OTHER): Payer: Medicaid Other

## 2013-01-06 ENCOUNTER — Emergency Department (HOSPITAL_BASED_OUTPATIENT_CLINIC_OR_DEPARTMENT_OTHER)
Admission: EM | Admit: 2013-01-06 | Discharge: 2013-01-06 | Disposition: A | Discharge status: Home / Self Care | Payer: Medicaid Other | Attending: Emergency Medicine | Admitting: Emergency Medicine

## 2013-01-06 ENCOUNTER — Encounter (HOSPITAL_BASED_OUTPATIENT_CLINIC_OR_DEPARTMENT_OTHER): Payer: Self-pay | Admitting: *Deleted

## 2013-01-06 DIAGNOSIS — R1110 Vomiting, unspecified: Secondary | ICD-10-CM

## 2013-01-06 DIAGNOSIS — R112 Nausea with vomiting, unspecified: Secondary | ICD-10-CM | POA: Insufficient documentation

## 2013-01-06 DIAGNOSIS — Z792 Long term (current) use of antibiotics: Secondary | ICD-10-CM | POA: Insufficient documentation

## 2013-01-06 DIAGNOSIS — N730 Acute parametritis and pelvic cellulitis: Secondary | ICD-10-CM

## 2013-01-06 DIAGNOSIS — Z87440 Personal history of urinary (tract) infections: Secondary | ICD-10-CM | POA: Insufficient documentation

## 2013-01-06 DIAGNOSIS — R197 Diarrhea, unspecified: Secondary | ICD-10-CM | POA: Insufficient documentation

## 2013-01-06 DIAGNOSIS — R509 Fever, unspecified: Secondary | ICD-10-CM | POA: Insufficient documentation

## 2013-01-06 DIAGNOSIS — Z862 Personal history of diseases of the blood and blood-forming organs and certain disorders involving the immune mechanism: Secondary | ICD-10-CM | POA: Insufficient documentation

## 2013-01-06 DIAGNOSIS — Z8619 Personal history of other infectious and parasitic diseases: Secondary | ICD-10-CM | POA: Insufficient documentation

## 2013-01-06 DIAGNOSIS — Z79899 Other long term (current) drug therapy: Secondary | ICD-10-CM | POA: Insufficient documentation

## 2013-01-06 DIAGNOSIS — F17200 Nicotine dependence, unspecified, uncomplicated: Secondary | ICD-10-CM | POA: Insufficient documentation

## 2013-01-06 LAB — CBC WITH DIFFERENTIAL/PLATELET
Basophils Absolute: 0 10*3/uL (ref 0.0–0.1)
Basophils Relative: 0 % (ref 0–1)
Eosinophils Absolute: 0 10*3/uL (ref 0.0–0.7)
Eosinophils Relative: 0 % (ref 0–5)
HCT: 37.1 % (ref 36.0–46.0)
Lymphocytes Relative: 4 % — ABNORMAL LOW (ref 12–46)
MCV: 93.5 fL (ref 78.0–100.0)
Monocytes Absolute: 0.4 10*3/uL (ref 0.1–1.0)
Neutrophils Relative %: 92 % — ABNORMAL HIGH (ref 43–77)
Platelets: 302 10*3/uL (ref 150–400)
RDW: 12.5 % (ref 11.5–15.5)
WBC: 9 10*3/uL (ref 4.0–10.5)

## 2013-01-06 LAB — URINE CULTURE
Colony Count: NO GROWTH
Culture: NO GROWTH

## 2013-01-06 LAB — BASIC METABOLIC PANEL
CO2: 25 mEq/L (ref 19–32)
Calcium: 9.5 mg/dL (ref 8.4–10.5)
Chloride: 102 mEq/L (ref 96–112)
Creatinine, Ser: 0.6 mg/dL (ref 0.50–1.10)
GFR calc Af Amer: >90 mL/min (ref >90)
GFR calc non Af Amer: >90 mL/min (ref >90)
Sodium: 139 mEq/L (ref 135–145)

## 2013-01-06 MED ORDER — HYDROMORPHONE HCL PF 1 MG/ML IJ SOLN
1.0000 mg | Freq: Once | INTRAMUSCULAR | Status: AC
  Administered 2013-01-06: 1 mg via INTRAVENOUS
  Filled 2013-01-06: qty 1

## 2013-01-06 MED ORDER — ONDANSETRON HCL 4 MG/2ML IJ SOLN
4.0000 mg | Freq: Once | INTRAMUSCULAR | Status: AC
  Administered 2013-01-06: 4 mg via INTRAVENOUS
  Filled 2013-01-06: qty 2

## 2013-01-06 MED ORDER — HYDROMORPHONE HCL PF 1 MG/ML IJ SOLN: 1.0000 mg | Freq: Once | INTRAMUSCULAR | Status: DC

## 2013-01-06 MED ORDER — SODIUM CHLORIDE 0.9 % IV BOLUS (SEPSIS)
1000.0000 mL | Freq: Once | INTRAVENOUS | Status: AC
  Administered 2013-01-06: 1000 mL via INTRAVENOUS

## 2013-01-06 MED ORDER — MORPHINE SULFATE 4 MG/ML IJ SOLN
4.0000 mg | Freq: Once | INTRAMUSCULAR | Status: AC
  Administered 2013-01-06: 4 mg via INTRAVENOUS
  Filled 2013-01-06: qty 1

## 2013-01-06 MED ORDER — OXYCODONE-ACETAMINOPHEN 5-325 MG PO TABS: 1.0000 | ORAL_TABLET | ORAL | Status: DC | PRN

## 2013-01-06 MED ORDER — MORPHINE SULFATE 4 MG/ML IJ SOLN
4.0000 mg | Freq: Once | INTRAMUSCULAR | Status: DC
  Filled 2013-01-06: qty 1

## 2013-01-06 NOTE — ED Notes (Addendum)
Pt began feeling ill a week ago, pt was dx with UTI and began taking antibiotics then.  Pt was seen Friday, yesterday and now again today bc she is continuing to feel worse.  Pt states that she has lower abdominal pain, nausea, vomiting (since last night x4) and diarrhea since yesterday ("its pouring out of me").  Pt has also had fever and chills with this.  Pt is tearful and requesting to be admitted.

## 2013-01-06 NOTE — ED Provider Notes (Signed)
CSN: [id number]     Arrival date & time 01/06/13  1249 History   First MD Initiated Contact with Patient 01/06/13 1254     Chief Complaint  Patient presents with  . Pelvic Pain   (Consider location/radiation/quality/duration/timing/severity/associated sxs/prior Treatment) HPI Comments: Patient is a 27 y.o. female with no significant past medical history who presents to the emergency department with complaints of diffuse lower abdominal pain and lower back pain that have been present for the past week. She was diagnosed with urinary tract infection and was started on Macrobid by her PCP over one week ago. She took several days worth of this medication but when her symptoms became worse, she presented to the emergency department on September 26. She was diagnosed with pyelonephritis based on urine and CT scan and was recommended admission but she wanted to go home.  She was also noted to have cervical motion tenderness on pelvic exam and a Bartholin's cyst. Patient was given a dose of IV Rocephin for her UTI and possible PID and was discharged home on Keflex and doxycycline. Patient returned to the ED on September 28 with continued symptoms. Her urinalysis showed improvement and her labs were unchanged. She again wanted discharge home and was instructed to continue Keflex and doxycycline. Pt was seen yesterday and was treated for chlamydia and was sent home on dilaudid:pt states that she started having vomiting diarrhea and chills last night       The history is provided by the patient. No language interpreter was used.    Past Medical History  Diagnosis Date  . Anemia    Past Surgical History  Procedure Laterality Date  . Cesarean section     No family history on file. History  Substance Use Topics  . Smoking status: Current Every Day Smoker  . Smokeless tobacco: Not on file  . Alcohol Use: No   OB History   Grav Para Term Preterm Abortions TAB SAB Ect Mult Living                  Review of Systems  Constitutional: Positive for chills. Negative for fever.  Respiratory: Negative.   Cardiovascular: Negative.     Allergies  Sulfa antibiotics  Home Medications   Current Outpatient Rx  Name  Route  Sig  Dispense  Refill  . cephALEXin (KEFLEX) 500 MG capsule   Oral   Take 500 mg by mouth 4 (four) times daily.         . cephALEXin (KEFLEX) 500 MG capsule   Oral   Take 1 capsule (500 mg total) by mouth 4 (four) times daily.   16 capsule   0   . doxycycline (VIBRAMYCIN) 100 MG capsule   Oral   Take 1 capsule (100 mg total) by mouth 2 (two) times daily.   28 capsule   0   . escitalopram (LEXAPRO) 20 MG tablet   Oral   Take 20 mg by mouth daily.         Marland Kitchen HYDROcodone-acetaminophen (NORCO/VICODIN) 5-325 MG per tablet      Take 1-2 tablets every 6 hours as needed for severe pain   10 tablet   0   . HYDROmorphone (DILAUDID) 2 MG tablet   Oral   Take 1 tablet (2 mg total) by mouth every 4 (four) hours as needed for pain.   15 tablet   0   . metroNIDAZOLE (FLAGYL) 500 MG tablet   Oral   Take 1 tablet (500  mg total) by mouth 2 (two) times daily.   14 tablet   0   . ondansetron (ZOFRAN) 4 MG tablet   Oral   Take 1 tablet (4 mg total) by mouth every 6 (six) hours.   12 tablet   0   . oxyCODONE-acetaminophen (PERCOCET/ROXICET) 5-325 MG per tablet   Oral   Take 1 tablet by mouth every 4 (four) hours as needed for pain.   12 tablet   0   . promethazine (PHENERGAN) 25 MG tablet   Oral   Take 1 tablet (25 mg total) by mouth every 6 (six) hours as needed for nausea.   10 tablet   0    BP 121/81  Pulse 102  Temp(Src) 99.8 F (37.7 C) (Oral)  Resp 22  Ht 5\' 6"  (1.676 m)  Wt 128 lb (58.06 kg)  BMI 20.67 kg/m2  SpO2 100%  LMP 12/24/2012 Physical Exam  Nursing note and vitals reviewed. Constitutional: She is oriented to person, place, and time. She appears well-developed and well-nourished.  Eyes: Conjunctivae and EOM are normal.   Cardiovascular: Normal rate and regular rhythm.   Pulmonary/Chest: Effort normal and breath sounds normal.  Abdominal:  Generalized lower abdominal tenderness  Genitourinary:  Right adnexal tenderness:copious amount of gray discharge:pt has small raised no red no tender area to the right labia  Musculoskeletal: Normal range of motion.  Neurological: She is alert and oriented to person, place, and time.  Skin: Skin is warm and dry.  Psychiatric:  Tearful and extremely anxious    ED Course  Procedures (including critical care time) Labs Review Labs Reviewed  BASIC METABOLIC PANEL - Abnormal; Notable for the following:    Potassium 3.3 (*)    Glucose, Bld 101 (*)    All other components within normal limits  CBC WITH DIFFERENTIAL - Abnormal; Notable for the following:    Neutrophils Relative % 92 (*)    Neutro Abs 8.3 (*)    Lymphocytes Relative 4 (*)    Lymphs Abs 0.4 (*)    All other components within normal limits   Imaging Review US Transvaginal Non-ob  01/06/2013   CLINICAL DATA:  Severe pelvic pain. Nausea and vomiting. Fever and chills. LMP 12/1912.  EXAM: TRANSABDOMINAL AND TRANSVAGINAL ULTRASOUND OF PELVIS  TECHNIQUE: Both transabdominal and transvaginal ultrasound examinations of the pelvis were performed. Transabdominal technique was performed for global imaging of the pelvis including uterus, ovaries, adnexal regions, and pelvic cul-de-sac. It was necessary to proceed with endovaginal exam following the transabdominal exam to visualize the endometrium and ovaries.  COMPARISON:  None  FINDINGS: Uterus  Measurements: 7.4 x 3.1 x 4.1 cm. No fibroids or other mass visualized.  Endometrium  Thickness: 5 mm.  No focal abnormality visualized.  Right ovary  Measurements: 3.2 x 2.6 x 2.1 cm. Normal appearance/no adnexal mass.  Left ovary  Measurements: Not directly visualized by transabdominal or transvaginal sonography, however no adnexal mass identified.  Other findings  No free  fluid.  IMPRESSION: Normal appearance of uterus and right ovary.  Nonvisualization of left ovary, however no adnexal mass or other significant abnormality identified.   Electronically Signed   By: Myles Rosenthal   On: 01/06/2013 15:07    MDM   1. PID (acute pelvic inflammatory disease)   2. Vomiting and diarrhea    Pt is tolerating po here without any problem and pt is comfortable;discussed the case with Dr. Ladean Raya with gyn and she states that there is nothing more  to be done at this time:pt was treated yesterday with rocephin and zithromax and this is going to take a couple days for it to get better:pt has gyn follow up:pt is okay to follow up with her ob next week;pt was given another script for percocet as pt needs states dialudid is not working    Teressa Lower, NP 01/06/13 1626

## 2013-01-06 NOTE — ED Notes (Signed)
Pt has increasing pain, NP notified.  New orders received

## 2013-01-06 NOTE — ED Notes (Signed)
NP at the bedside to see pt.

## 2013-01-06 NOTE — ED Notes (Signed)
Pt tolerating Coke well without return of vomiting.

## 2013-01-07 NOTE — ED Notes (Signed)
Patient informed of positive results after id'd x 2 and informed of need to notify partner to be treated. 

## 2013-01-10 NOTE — ED Provider Notes (Signed)
Medical screening examination/treatment/procedure(s) were conducted as a shared visit with non-physician practitioner(s) or resident and myself. I personally evaluated the patient during the encounter and agree with the findings and plan unless otherwise indicated.  Recent UTI and PID. Pt has had vomiting and feels dehydrated with recurrent pelvic pain. Pt on abx outpt.  Tachycardia improved with pain meds and fluids. Pelvic US to look for abscess. Improved on recheck.  Bilateral pelvic pain, no guarding, mild dry mm, sinus tach. Plan for fluids/ lab work/ [place].  Improved in ED.  [place] no acute findings.  Close fup with OBGYN arranged.   PID, Pelvic pain, Vomiting and diarrhea   Enid Skeens, MD 01/10/13 [id number]

## 2013-04-23 ENCOUNTER — Emergency Department (HOSPITAL_COMMUNITY)
Admission: EM | Admit: 2013-04-23 | Discharge: 2013-04-23 | Disposition: A | Discharge status: Home / Self Care | Payer: Medicaid Other | Attending: Emergency Medicine | Admitting: Emergency Medicine

## 2013-04-23 ENCOUNTER — Encounter (HOSPITAL_COMMUNITY): Payer: Self-pay | Admitting: Emergency Medicine

## 2013-04-23 DIAGNOSIS — F17200 Nicotine dependence, unspecified, uncomplicated: Secondary | ICD-10-CM | POA: Insufficient documentation

## 2013-04-23 DIAGNOSIS — F3289 Other specified depressive episodes: Secondary | ICD-10-CM | POA: Insufficient documentation

## 2013-04-23 DIAGNOSIS — Z862 Personal history of diseases of the blood and blood-forming organs and certain disorders involving the immune mechanism: Secondary | ICD-10-CM | POA: Insufficient documentation

## 2013-04-23 DIAGNOSIS — F329 Major depressive disorder, single episode, unspecified: Secondary | ICD-10-CM | POA: Insufficient documentation

## 2013-04-23 DIAGNOSIS — Z79899 Other long term (current) drug therapy: Secondary | ICD-10-CM | POA: Insufficient documentation

## 2013-04-23 DIAGNOSIS — R000 Tachycardia, unspecified: Secondary | ICD-10-CM | POA: Insufficient documentation

## 2013-04-23 DIAGNOSIS — Z760 Encounter for issue of repeat prescription: Secondary | ICD-10-CM | POA: Insufficient documentation

## 2013-04-23 DIAGNOSIS — G2581 Restless legs syndrome: Secondary | ICD-10-CM | POA: Insufficient documentation

## 2013-04-23 DIAGNOSIS — Z792 Long term (current) use of antibiotics: Secondary | ICD-10-CM | POA: Insufficient documentation

## 2013-04-23 DIAGNOSIS — F411 Generalized anxiety disorder: Secondary | ICD-10-CM | POA: Insufficient documentation

## 2013-04-23 MED ORDER — SERTRALINE HCL 50 MG PO TABS
100.0000 mg | ORAL_TABLET | Freq: Every day | ORAL | Status: DC
  Administered 2013-04-23: 100 mg via ORAL
  Filled 2013-04-23: qty 2

## 2013-04-23 MED ORDER — GABAPENTIN 400 MG PO CAPS: 400.0000 mg | ORAL_CAPSULE | Freq: Two times a day (BID) | ORAL | Status: DC

## 2013-04-23 MED ORDER — SERTRALINE HCL 100 MG PO TABS: 100.0000 mg | ORAL_TABLET | Freq: Every day | ORAL | Status: DC

## 2013-04-23 MED ORDER — GABAPENTIN 400 MG PO CAPS
1200.0000 mg | ORAL_CAPSULE | Freq: Once | ORAL | Status: AC
  Administered 2013-04-23: 1200 mg via ORAL
  Filled 2013-04-23: qty 3

## 2013-04-23 NOTE — ED Notes (Signed)
Pt pulse was in the 150's, before I left from obtaining vitals it when down to 76

## 2013-04-23 NOTE — ED Provider Notes (Signed)
CSN: [id number]     Arrival date & time 04/23/13  1825 History  This chart was scribed for non-physician practitioner, Fayrene HelperBowie Skyylar Kopf, PA-C,working with Audree CamelScott T Goldston, MD, by Karle PlumberJennifer Tensley, ED Scribe.  This patient was seen in room WTR9/WTR9 and the patient's care was started at 7:09 PM.  Chief Complaint  Patient presents with  . Medication Refill   The history is provided by the patient. No language interpreter was used.   HPI Comments:  Robin Thornton is a 28 y.o. female who presents to the Emergency Department needing medication refills on her Neurontin and Zoloft. She states she takes the Zoloft for depression and the Neurontin for restless leg syndrome and has been out for the past 2-3 days. She states she has taken the medications for several months. 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  She as successfully weaned herself off ?Xuboxone. She reports contacting her PCP today and states she was not available secondary to having surgery. Pt is able to produce a medication bottle and gives the pharmacy phone number ([facility] [phone]) to verify dosages. She states they are probably closed since it is after 7 PM. She states she has been very sick since running out of the medications. Pt denies fever and chills.   Past Medical History  Diagnosis Date  . Anemia    Past Surgical History  Procedure Laterality Date  . Cesarean section     No family history on file. History  Substance Use Topics  . Smoking status: Current Every Day Smoker  . Smokeless tobacco: Not on file  . Alcohol Use: No   OB History   Grav Para Term Preterm Abortions TAB SAB Ect Mult Living                 Review of Systems  Constitutional: Negative for fever and chills.  Psychiatric/Behavioral: The patient is nervous/anxious.     Allergies  Sulfa antibiotics  Home Medications   Current Outpatient Rx  Name  Route  Sig  Dispense  Refill  .  cephALEXin (KEFLEX) 500 MG capsule   Oral   Take 500 mg by mouth 4 (four) times daily.         . cephALEXin (KEFLEX) 500 MG capsule   Oral   Take 1 capsule (500 mg total) by mouth 4 (four) times daily.   16 capsule   0   . doxycycline (VIBRAMYCIN) 100 MG capsule   Oral   Take 1 capsule (100 mg total) by mouth 2 (two) times daily.   28 capsule   0   . escitalopram (LEXAPRO) 20 MG tablet   Oral   Take 20 mg by mouth daily.         Marland Kitchen. HYDROcodone-acetaminophen (NORCO/VICODIN) 5-325 MG per tablet      Take 1-2 tablets every 6 hours as needed for severe pain   10 tablet   0   . HYDROmorphone (DILAUDID) 2 MG tablet   Oral   Take 1 tablet (2 mg total) by mouth every 4 (four) hours as needed for pain.   15 tablet   0   . metroNIDAZOLE (FLAGYL) 500 MG tablet   Oral   Take 1 tablet (500 mg total) by mouth 2 (two) times daily.   14 tablet   0   . ondansetron (ZOFRAN) 4 MG tablet   Oral   Take 1 tablet (4 mg total) by mouth  every 6 (six) hours.   12 tablet   0   . oxyCODONE-acetaminophen (PERCOCET/ROXICET) 5-325 MG per tablet   Oral   Take 1 tablet by mouth every 4 (four) hours as needed for pain.   12 tablet   0   . oxyCODONE-acetaminophen (PERCOCET/ROXICET) 5-325 MG per tablet   Oral   Take 1-2 tablets by mouth every 4 (four) hours as needed for pain.   6 tablet   0   . promethazine (PHENERGAN) 25 MG tablet   Oral   Take 1 tablet (25 mg total) by mouth every 6 (six) hours as needed for nausea.   10 tablet   0    Triage Vitals: BP 110/82  Pulse 156  Temp(Src) 98.4 F (36.9 C) (Oral)  SpO2 99%  LMP 03/31/2013 Physical Exam  Nursing note and vitals reviewed. Constitutional: She is oriented to person, place, and time. She appears well-developed and well-nourished.  Mildly anxious, but non-toxic appearing.  HENT:  Head: Normocephalic and atraumatic.  Eyes: EOM are normal.  Neck: Normal range of motion.  Cardiovascular: Regular rhythm.  Tachycardia  present.  Exam reveals no gallop and no friction rub.   No murmur heard. Pulmonary/Chest: Effort normal and breath sounds normal. No respiratory distress. She has no wheezes. She has no rales. She exhibits no tenderness.  Musculoskeletal: Normal range of motion.  Neurological: She is alert and oriented to person, place, and time.  Skin: Skin is warm and dry.  Psychiatric: Her mood appears anxious.    ED Course  Procedures (including critical care time) DIAGNOSTIC STUDIES: Oxygen Saturation is 99% on RA, normal by my interpretation.   COORDINATION OF CARE: 7:11 PM- Will refill medication and informed pt to follow up with her PCP for additional refills. Pt verbalizes understanding and agrees to plan.  7:36 PM Pt did show bottle of medication for verification.  Will refill but recommend close f/u with her doctor for further care.    Medications - No data to display  Labs Review Labs Reviewed - No data to display Imaging Review No results found.  EKG Interpretation   None       MDM   1. Medication refill    BP 110/82  Pulse 97  Temp(Src) 98.4 F (36.9 C) (Oral)  SpO2 99%  LMP 03/31/2013   I personally performed the services described in this documentation, which was scribed in my presence. The recorded information has been reviewed and is accurate.    Fayrene Helper, PA-C 04/23/13 [phone]

## 2013-04-23 NOTE — Discharge Instructions (Signed)
Medication Refill, Emergency Department  We have refilled your medication today as a courtesy to you. It is best for your medical care, however, to take care of getting refills done through your primary caregiver's office. They have your records and can do a better job of follow-up than we can in the emergency department.  On maintenance medications, we often only prescribe enough medications to get you by until you are able to see your regular caregiver. This is a more expensive way to refill medications.  In the future, please plan for refills so that you will not have to use the emergency department for this.  Thank you for your help. Your help allows us to better take care of the daily emergencies that enter our department.  Document Released: 07/11/2003 Document Revised: 06/16/2011 Document Reviewed: 03/24/2005  [employer]® Patient Information ©2014 [employer].

## 2013-04-23 NOTE — ED Notes (Signed)
Patient is being seen at the [facility] and needs a refill on zoloft, neurotin

## 2013-04-24 MED ORDER — GABAPENTIN 400 MG PO CAPS: 400.0000 mg | ORAL_CAPSULE | Freq: Two times a day (BID) | ORAL | Status: DC

## 2013-04-24 NOTE — ED Provider Notes (Signed)
Spoke with Delice Bisonara case Production designer, theatre/television/filmmanager. [facility] Pharmacist called to clarify orders. Patient seen yesterday 04/23/13 in the ED for medication refill.  Neurontin 400 mg capsules two times daily (take 3 capsules in the morning and 3 capsules at bedtime).  Spoke with pharmacist who dispensed 1200 twice daily.  Spoke with Fayrene HelperBowie Tran PA-C who confirmed this is the correct dose. Unable to contact patient.  Phone number provided was disconnected.     Greer EeJessica Katlin Kinslee Dalpe PA-C   Jillyn LedgerJessica K Davina Howlett, PA-C 04/25/13 [phone]

## 2013-04-24 NOTE — Progress Notes (Signed)
CM spoke with FT PA- Coral CeoJessica Palmer [facility].He reports he already dispensed patients medication.

## 2013-04-24 NOTE — Progress Notes (Signed)
Incoming call from [facility] [phone]. He needs clarification on Patients Neurotin prescription.Correction sheet completed and placed in fast track folder for PA REVIEW.

## 2013-04-24 NOTE — ED Provider Notes (Signed)
Medical screening examination/treatment/procedure(s) were performed by non-physician practitioner and as supervising physician I was immediately available for consultation/collaboration.  EKG Interpretation   None         Audree CamelScott T Jalynn Waddell, MD 04/24/13 [phone]

## 2013-04-27 NOTE — ED Provider Notes (Signed)
Medical screening examination/treatment/procedure(s) were performed by non-physician practitioner and as supervising physician I was immediately available for consultation/collaboration.  EKG Interpretation   None         Vail Basista, MD 04/27/13 1506 

## 2013-12-02 ENCOUNTER — Emergency Department (HOSPITAL_COMMUNITY)
Admission: EM | Admit: 2013-12-02 | Discharge: 2013-12-03 | Disposition: A | Discharge status: Home / Self Care | Payer: Medicaid Other | Attending: Emergency Medicine | Admitting: Emergency Medicine

## 2013-12-02 ENCOUNTER — Encounter (HOSPITAL_COMMUNITY): Payer: Self-pay | Admitting: Emergency Medicine

## 2013-12-02 DIAGNOSIS — Z862 Personal history of diseases of the blood and blood-forming organs and certain disorders involving the immune mechanism: Secondary | ICD-10-CM | POA: Insufficient documentation

## 2013-12-02 DIAGNOSIS — Z23 Encounter for immunization: Secondary | ICD-10-CM | POA: Insufficient documentation

## 2013-12-02 DIAGNOSIS — Z79899 Other long term (current) drug therapy: Secondary | ICD-10-CM | POA: Insufficient documentation

## 2013-12-02 DIAGNOSIS — S61509A Unspecified open wound of unspecified wrist, initial encounter: Secondary | ICD-10-CM | POA: Diagnosis not present

## 2013-12-02 DIAGNOSIS — F17200 Nicotine dependence, unspecified, uncomplicated: Secondary | ICD-10-CM | POA: Diagnosis not present

## 2013-12-02 DIAGNOSIS — S51809A Unspecified open wound of unspecified forearm, initial encounter: Secondary | ICD-10-CM | POA: Insufficient documentation

## 2013-12-02 DIAGNOSIS — S56909A Unspecified injury of unspecified muscles, fascia and tendons at forearm level, unspecified arm, initial encounter: Principal | ICD-10-CM

## 2013-12-02 DIAGNOSIS — Y92009 Unspecified place in unspecified non-institutional (private) residence as the place of occurrence of the external cause: Secondary | ICD-10-CM | POA: Diagnosis not present

## 2013-12-02 DIAGNOSIS — R000 Tachycardia, unspecified: Secondary | ICD-10-CM | POA: Insufficient documentation

## 2013-12-02 DIAGNOSIS — S51819A Laceration without foreign body of unspecified forearm, initial encounter: Secondary | ICD-10-CM

## 2013-12-02 DIAGNOSIS — W268XXA Contact with other sharp object(s), not elsewhere classified, initial encounter: Secondary | ICD-10-CM | POA: Insufficient documentation

## 2013-12-02 DIAGNOSIS — Y9389 Activity, other specified: Secondary | ICD-10-CM | POA: Diagnosis not present

## 2013-12-02 DIAGNOSIS — W2209XA Striking against other stationary object, initial encounter: Secondary | ICD-10-CM | POA: Diagnosis not present

## 2013-12-02 DIAGNOSIS — S66909A Unspecified injury of unspecified muscle, fascia and tendon at wrist and hand level, unspecified hand, initial encounter: Secondary | ICD-10-CM | POA: Insufficient documentation

## 2013-12-02 DIAGNOSIS — S56929A Laceration of unspecified muscles, fascia and tendons at forearm level, unspecified arm, initial encounter: Secondary | ICD-10-CM

## 2013-12-02 DIAGNOSIS — S51812A Laceration without foreign body of left forearm, initial encounter: Secondary | ICD-10-CM

## 2013-12-02 HISTORY — DX: Laceration of unspecified muscles, fascia and tendons at forearm level, unspecified arm, initial encounter: S56.929A

## 2013-12-02 HISTORY — DX: Laceration without foreign body of unspecified forearm, initial encounter: S51.819A

## 2013-12-02 MED ORDER — CEFAZOLIN SODIUM 1-5 GM-% IV SOLN
1.0000 g | Freq: Once | INTRAVENOUS | Status: AC
  Administered 2013-12-02: 1 g via INTRAVENOUS
  Filled 2013-12-02: qty 50

## 2013-12-02 MED ORDER — HYDROMORPHONE HCL PF 1 MG/ML IJ SOLN
1.0000 mg | Freq: Once | INTRAMUSCULAR | Status: AC
  Administered 2013-12-02: 1 mg via INTRAVENOUS
  Filled 2013-12-02: qty 1

## 2013-12-02 MED ORDER — ONDANSETRON HCL 4 MG/2ML IJ SOLN
4.0000 mg | Freq: Once | INTRAMUSCULAR | Status: AC
  Administered 2013-12-02: 4 mg via INTRAVENOUS
  Filled 2013-12-02: qty 2

## 2013-12-02 MED ORDER — TETANUS-DIPHTH-ACELL PERTUSSIS 5-2.5-18.5 LF-MCG/0.5 IM SUSP
0.5000 mL | Freq: Once | INTRAMUSCULAR | Status: AC
  Administered 2013-12-02: 0.5 mL via INTRAMUSCULAR
  Filled 2013-12-02: qty 0.5

## 2013-12-02 NOTE — ED Notes (Signed)
Manual cuff pressure on left upper arm maintained for 20 minutes (100 - 200 mmHg).
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
## 2013-12-03 ENCOUNTER — Encounter (HOSPITAL_COMMUNITY): Payer: Self-pay | Admitting: Emergency Medicine

## 2013-12-03 ENCOUNTER — Emergency Department (HOSPITAL_COMMUNITY)
Admission: EM | Admit: 2013-12-03 | Discharge: 2013-12-03 | Disposition: A | Discharge status: Home / Self Care | Payer: Medicaid Other | Source: Home / Self Care | Attending: Emergency Medicine | Admitting: Emergency Medicine

## 2013-12-03 ENCOUNTER — Emergency Department (HOSPITAL_COMMUNITY): Payer: Medicaid Other

## 2013-12-03 DIAGNOSIS — F17200 Nicotine dependence, unspecified, uncomplicated: Secondary | ICD-10-CM | POA: Insufficient documentation

## 2013-12-03 DIAGNOSIS — M7989 Other specified soft tissue disorders: Secondary | ICD-10-CM | POA: Insufficient documentation

## 2013-12-03 DIAGNOSIS — M25532 Pain in left wrist: Secondary | ICD-10-CM | POA: Diagnosis present

## 2013-12-03 DIAGNOSIS — R209 Unspecified disturbances of skin sensation: Secondary | ICD-10-CM | POA: Insufficient documentation

## 2013-12-03 DIAGNOSIS — Z4801 Encounter for change or removal of surgical wound dressing: Secondary | ICD-10-CM | POA: Insufficient documentation

## 2013-12-03 DIAGNOSIS — Z862 Personal history of diseases of the blood and blood-forming organs and certain disorders involving the immune mechanism: Secondary | ICD-10-CM | POA: Insufficient documentation

## 2013-12-03 DIAGNOSIS — Z5189 Encounter for other specified aftercare: Secondary | ICD-10-CM

## 2013-12-03 DIAGNOSIS — M25539 Pain in unspecified wrist: Secondary | ICD-10-CM | POA: Insufficient documentation

## 2013-12-03 MED ORDER — HYDROCODONE-ACETAMINOPHEN 5-325 MG PO TABS: 1.0000 | ORAL_TABLET | ORAL | Status: DC | PRN

## 2013-12-03 MED ORDER — HYDROMORPHONE HCL PF 1 MG/ML IJ SOLN
1.0000 mg | Freq: Once | INTRAMUSCULAR | Status: AC
  Administered 2013-12-03: 1 mg via INTRAVENOUS
  Filled 2013-12-03: qty 1

## 2013-12-03 MED ORDER — GABAPENTIN 100 MG PO CAPS: 100.0000 mg | ORAL_CAPSULE | Freq: Two times a day (BID) | ORAL | Status: DC

## 2013-12-03 MED ORDER — ONDANSETRON HCL 4 MG PO TABS: 4.0000 mg | ORAL_TABLET | Freq: Three times a day (TID) | ORAL | Status: AC | PRN

## 2013-12-03 MED ORDER — CEPHALEXIN 500 MG PO CAPS: 500.0000 mg | ORAL_CAPSULE | Freq: Four times a day (QID) | ORAL | Status: AC

## 2013-12-03 MED ORDER — SODIUM CHLORIDE 0.9 % IV BOLUS (SEPSIS)
1000.0000 mL | Freq: Once | INTRAVENOUS | Status: AC
  Administered 2013-12-03: 1000 mL via INTRAVENOUS

## 2013-12-03 MED ORDER — OXYCODONE-ACETAMINOPHEN 5-325 MG PO TABS: 1.0000 | ORAL_TABLET | ORAL | Status: DC | PRN

## 2013-12-03 MED ORDER — ONDANSETRON HCL 4 MG/2ML IJ SOLN
4.0000 mg | Freq: Once | INTRAMUSCULAR | Status: AC
  Administered 2013-12-03: 4 mg via INTRAVENOUS
  Filled 2013-12-03: qty 2

## 2013-12-03 NOTE — ED Notes (Signed)
During pts. Visit in the ED, pt. Not tolerating the pressure dressing or cuff pressure to control bleeding and while PA-C suturing artery.  Pt. Educated on the importance of controlled bleeding.  Pt. States,"want it loosened;"  During  the suturing/repairr pt. Thrashing arm because of the cuff pressure was high.  Cuff pressure during procedure was maintained between 100 mmhg and 150 mmhg.Marland Kitchen

## 2013-12-03 NOTE — ED Notes (Signed)
Pt requesting more pain medication- Dr. Malen Gauze at bedside.

## 2013-12-03 NOTE — Discharge Instructions (Signed)
Please followup with orthopedic hand surgeon for continued evaluation and treatment on Monday.    Laceration Care, Adult A laceration is a cut or lesion that goes through all layers of the skin and into the tissue just beneath the skin. TREATMENT  Some lacerations may not require closure. Some lacerations may not be able to be closed due to an increased risk of infection. It is important to see your caregiver as soon as possible after an injury to minimize the risk of infection and maximize the opportunity for successful closure. If closure is appropriate, pain medicines may be given, if needed. The wound will be cleaned to help prevent infection. Your caregiver will use stitches (sutures), staples, wound glue (adhesive), or skin adhesive strips to repair the laceration. These tools bring the skin edges together to allow for faster healing and a better cosmetic outcome. However, all wounds will heal with a scar. Once the wound has healed, scarring can be minimized by covering the wound with sunscreen during the day for 1 full year. HOME CARE INSTRUCTIONS  For sutures or staples:  Keep the wound clean and dry.  If you were given a bandage (dressing), you should change it at least once a day. Also, change the dressing if it becomes wet or dirty, or as directed by your caregiver.  Wash the wound with soap and water 2 times a day. Rinse the wound off with water to remove all soap. Pat the wound dry with a clean towel.  After cleaning, apply a thin layer of the antibiotic ointment as recommended by your caregiver. This will help prevent infection and keep the dressing from sticking.  You may shower as usual after the first 24 hours. Do not soak the wound in water until the sutures are removed.  Only take over-the-counter or prescription medicines for pain, discomfort, or fever as directed by your caregiver.  Get your sutures or staples removed as directed by your caregiver. For skin adhesive  strips:  Keep the wound clean and dry.  Do not get the skin adhesive strips wet. You may bathe carefully, using caution to keep the wound dry.  If the wound gets wet, pat it dry with a clean towel.  Skin adhesive strips will fall off on their own. You may trim the strips as the wound heals. Do not remove skin adhesive strips that are still stuck to the wound. They will fall off in time. For wound adhesive:  You may briefly wet your wound in the shower or bath. Do not soak or scrub the wound. Do not swim. Avoid periods of heavy perspiration until the skin adhesive has fallen off on its own. After showering or bathing, gently pat the wound dry with a clean towel.  Do not apply liquid medicine, cream medicine, or ointment medicine to your wound while the skin adhesive is in place. This may loosen the film before your wound is healed.  If a dressing is placed over the wound, be careful not to apply tape directly over the skin adhesive. This may cause the adhesive to be pulled off before the wound is healed.  Avoid prolonged exposure to sunlight or tanning lamps while the skin adhesive is in place. Exposure to ultraviolet light in the first year will darken the scar.  The skin adhesive will usually remain in place for 5 to 10 days, then naturally fall off the skin. Do not pick at the adhesive film. You may need a tetanus shot if:  You  cannot remember when you had your last tetanus shot.  You have never had a tetanus shot. If you get a tetanus shot, your arm may swell, get red, and feel warm to the touch. This is common and not a problem. If you need a tetanus shot and you choose not to have one, there is a rare chance of getting tetanus. Sickness from tetanus can be serious. SEEK MEDICAL CARE IF:   You have redness, swelling, or increasing pain in the wound.  You see a red line that goes away from the wound.  You have yellowish-white fluid (pus) coming from the wound.  You have a  fever.  You notice a bad smell coming from the wound or dressing.  Your wound breaks open before or after sutures have been removed.  You notice something coming out of the wound such as wood or glass.  Your wound is on your hand or foot and you cannot move a finger or toe. SEEK IMMEDIATE MEDICAL CARE IF:   Your pain is not controlled with prescribed medicine.  You have severe swelling around the wound causing pain and numbness or a change in color in your arm, hand, leg, or foot.  Your wound splits open and starts bleeding.  You have worsening numbness, weakness, or loss of function of any joint around or beyond the wound.  You develop painful lumps near the wound or on the skin anywhere on your body. MAKE SURE YOU:   Understand these instructions.  Will watch your condition.  Will get help right away if you are not doing well or get worse. Document Released: 03/24/2005 Document Revised: 06/16/2011 Document Reviewed: 09/17/2010 [facility] Patient Information 2015 [place], [place]. This information is not intended to replace advice given to you by your health care provider. Make sure you discuss any questions you have with your health care provider.

## 2013-12-03 NOTE — ED Notes (Signed)
Pt seen here yesterday for radial artery L bleeding and cut tendon. Was supposed to follow up with surgeon on Monday for surgery but her hand has swollen twice as big. Pt has pressure dressing applied. Pt appears pale and sts she feels lightheaded. Dr. Janee Morn advised pt to come to ER. Last ate/drank at 7am.

## 2013-12-03 NOTE — ED Provider Notes (Signed)
Patient seen/examined in the Emergency Department in conjunction with Resident Physician Provider L[place] Patient reports increased pain/swelling in left wrist after recent laceration Exam : awake/alert,she is uncomfortable with swelling noted on left hand.  Distal radial pulse is intact Plan: will consult Hand surgery   Joya Gaskins, MD 12/03/13 [phone]

## 2013-12-03 NOTE — ED Notes (Signed)
The tech gave the patients wedding band (one 1/2 carat diamond ring and wedding band and two silver rings. The RN is aware.
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
## 2013-12-04 NOTE — ED Provider Notes (Signed)
I have personally seen and examined the patient.  I have discussed the plan of care with the resident.  I have reviewed the documentation on PMH/FH/Soc. History.  I have reviewed the documentation of the resident and agree.   Joya Gaskins, MD 12/04/13 [phone]

## 2013-12-05 ENCOUNTER — Encounter (HOSPITAL_BASED_OUTPATIENT_CLINIC_OR_DEPARTMENT_OTHER): Payer: Self-pay | Admitting: *Deleted

## 2013-12-06 ENCOUNTER — Other Ambulatory Visit: Payer: Self-pay | Admitting: Orthopedic Surgery
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
## 2013-12-09 ENCOUNTER — Encounter (HOSPITAL_BASED_OUTPATIENT_CLINIC_OR_DEPARTMENT_OTHER): Payer: Medicaid Other | Admitting: Anesthesiology

## 2013-12-09 ENCOUNTER — Ambulatory Visit (HOSPITAL_BASED_OUTPATIENT_CLINIC_OR_DEPARTMENT_OTHER): Payer: Medicaid Other | Admitting: Anesthesiology

## 2013-12-09 ENCOUNTER — Encounter (HOSPITAL_BASED_OUTPATIENT_CLINIC_OR_DEPARTMENT_OTHER): Payer: Self-pay

## 2013-12-09 ENCOUNTER — Ambulatory Visit (HOSPITAL_BASED_OUTPATIENT_CLINIC_OR_DEPARTMENT_OTHER)
Admission: RE | Admit: 2013-12-09 | Discharge: 2013-12-09 | Disposition: A | Discharge status: Home / Self Care | Payer: Medicaid Other | Source: Ambulatory Visit | Attending: Orthopedic Surgery | Admitting: Orthopedic Surgery

## 2013-12-09 ENCOUNTER — Encounter (HOSPITAL_BASED_OUTPATIENT_CLINIC_OR_DEPARTMENT_OTHER)
Admission: RE | Disposition: A | Discharge status: Home / Self Care | Payer: Self-pay | Source: Ambulatory Visit | Attending: Orthopedic Surgery

## 2013-12-09 DIAGNOSIS — S51809A Unspecified open wound of unspecified forearm, initial encounter: Secondary | ICD-10-CM | POA: Diagnosis present

## 2013-12-09 DIAGNOSIS — F909 Attention-deficit hyperactivity disorder, unspecified type: Secondary | ICD-10-CM | POA: Insufficient documentation

## 2013-12-09 DIAGNOSIS — S55809A Unspecified injury of other blood vessels at forearm level, unspecified arm, initial encounter: Secondary | ICD-10-CM | POA: Diagnosis not present

## 2013-12-09 DIAGNOSIS — Z885 Allergy status to narcotic agent status: Secondary | ICD-10-CM | POA: Diagnosis not present

## 2013-12-09 DIAGNOSIS — Z882 Allergy status to sulfonamides status: Secondary | ICD-10-CM | POA: Insufficient documentation

## 2013-12-09 DIAGNOSIS — A749 Chlamydial infection, unspecified: Secondary | ICD-10-CM | POA: Diagnosis not present

## 2013-12-09 DIAGNOSIS — G43909 Migraine, unspecified, not intractable, without status migrainosus: Secondary | ICD-10-CM | POA: Insufficient documentation

## 2013-12-09 DIAGNOSIS — Y92009 Unspecified place in unspecified non-institutional (private) residence as the place of occurrence of the external cause: Secondary | ICD-10-CM | POA: Insufficient documentation

## 2013-12-09 DIAGNOSIS — F17200 Nicotine dependence, unspecified, uncomplicated: Secondary | ICD-10-CM | POA: Insufficient documentation

## 2013-12-09 DIAGNOSIS — Y9389 Activity, other specified: Secondary | ICD-10-CM | POA: Diagnosis not present

## 2013-12-09 DIAGNOSIS — Z888 Allergy status to other drugs, medicaments and biological substances status: Secondary | ICD-10-CM | POA: Diagnosis not present

## 2013-12-09 DIAGNOSIS — S56909A Unspecified injury of unspecified muscles, fascia and tendons at forearm level, unspecified arm, initial encounter: Principal | ICD-10-CM

## 2013-12-09 DIAGNOSIS — Y998 Other external cause status: Secondary | ICD-10-CM | POA: Insufficient documentation

## 2013-12-09 DIAGNOSIS — W268XXA Contact with other sharp object(s), not elsewhere classified, initial encounter: Secondary | ICD-10-CM | POA: Insufficient documentation

## 2013-12-09 HISTORY — DX: Other injury of unspecified body region, initial encounter: T14.8XXA

## 2013-12-09 HISTORY — DX: Laceration of unspecified muscles, fascia and tendons at forearm level, unspecified arm, initial encounter: S56.929A

## 2013-12-09 HISTORY — DX: Spontaneous ecchymoses: R23.3

## 2013-12-09 HISTORY — DX: Attention-deficit hyperactivity disorder, unspecified type: F90.9

## 2013-12-09 HISTORY — DX: Personal history of urinary (tract) infections: Z87.440

## 2013-12-09 HISTORY — DX: Other skin changes: R23.8

## 2013-12-09 HISTORY — DX: Chlamydial infection, unspecified: A74.9

## 2013-12-09 HISTORY — PX: NERVE, TENDON AND ARTERY REPAIR: SHX5695

## 2013-12-09 HISTORY — DX: Laceration without foreign body of unspecified forearm, initial encounter: S51.819A

## 2013-12-09 HISTORY — DX: Other specified behavioral and emotional disorders with onset usually occurring in childhood and adolescence: F98.8

## 2013-12-09 HISTORY — DX: Personal history of other specified conditions: Z87.898

## 2013-12-09 HISTORY — DX: Migraine, unspecified, not intractable, without status migrainosus: G43.909

## 2013-12-09 HISTORY — DX: Arthralgia of temporomandibular joint, unspecified side: M26.629

## 2013-12-09 SURGERY — NERVE, TENDON AND ARTERY REPAIR
Anesthesia: General | Site: Arm Lower | Laterality: Left

## 2013-12-09 MED ORDER — DEXAMETHASONE SODIUM PHOSPHATE 4 MG/ML IJ SOLN
INTRAMUSCULAR | Status: DC | PRN
  Administered 2013-12-09: 4 mg via PERINEURAL

## 2013-12-09 MED ORDER — LACTATED RINGERS IV SOLN
INTRAVENOUS | Status: DC
  Administered 2013-12-09 (×3): via INTRAVENOUS

## 2013-12-09 MED ORDER — MIDAZOLAM HCL 5 MG/5ML IJ SOLN
INTRAMUSCULAR | Status: DC | PRN
  Administered 2013-12-09: 2 mg via INTRAVENOUS

## 2013-12-09 MED ORDER — HYDROMORPHONE HCL PF 1 MG/ML IJ SOLN
INTRAMUSCULAR | Status: AC
  Filled 2013-12-09: qty 1

## 2013-12-09 MED ORDER — FENTANYL CITRATE 0.05 MG/ML IJ SOLN
INTRAMUSCULAR | Status: AC
  Filled 2013-12-09: qty 6

## 2013-12-09 MED ORDER — BUPIVACAINE-EPINEPHRINE (PF) 0.5% -1:200000 IJ SOLN
INTRAMUSCULAR | Status: AC
  Filled 2013-12-09: qty 30

## 2013-12-09 MED ORDER — LIDOCAINE HCL (CARDIAC) 20 MG/ML IV SOLN
INTRAVENOUS | Status: DC | PRN
  Administered 2013-12-09: 60 mg via INTRAVENOUS

## 2013-12-09 MED ORDER — KETAMINE HCL 100 MG/ML IJ SOLN
INTRAMUSCULAR | Status: AC
  Filled 2013-12-09: qty 1

## 2013-12-09 MED ORDER — HYDROMORPHONE HCL PF 1 MG/ML IJ SOLN
0.2500 mg | INTRAMUSCULAR | Status: DC | PRN
  Administered 2013-12-09 (×5): 0.5 mg via INTRAVENOUS

## 2013-12-09 MED ORDER — MIDAZOLAM HCL 2 MG/2ML IJ SOLN: 1.0000 mg | INTRAMUSCULAR | Status: DC | PRN

## 2013-12-09 MED ORDER — PROMETHAZINE HCL 25 MG/ML IJ SOLN
INTRAMUSCULAR | Status: AC
  Filled 2013-12-09: qty 1

## 2013-12-09 MED ORDER — OXYCODONE HCL 5 MG PO TABS: 5.0000 mg | ORAL_TABLET | Freq: Once | ORAL | Status: DC | PRN

## 2013-12-09 MED ORDER — CEFAZOLIN SODIUM-DEXTROSE 2-3 GM-% IV SOLR
2.0000 g | INTRAVENOUS | Status: AC
  Administered 2013-12-09: 2 g via INTRAVENOUS

## 2013-12-09 MED ORDER — MIDAZOLAM HCL 2 MG/2ML IJ SOLN
INTRAMUSCULAR | Status: AC
  Filled 2013-12-09: qty 2

## 2013-12-09 MED ORDER — CEFAZOLIN SODIUM-DEXTROSE 2-3 GM-% IV SOLR
INTRAVENOUS | Status: AC
  Filled 2013-12-09: qty 50

## 2013-12-09 MED ORDER — KETAMINE HCL 10 MG/ML IJ SOLN
INTRAMUSCULAR | Status: DC | PRN
  Administered 2013-12-09: 30 mg via INTRAVENOUS

## 2013-12-09 MED ORDER — OXYCODONE HCL 5 MG PO TABS: 5.0000 mg | ORAL_TABLET | ORAL | Status: AC | PRN

## 2013-12-09 MED ORDER — PROPOFOL 10 MG/ML IV BOLUS
INTRAVENOUS | Status: DC | PRN
  Administered 2013-12-09: 200 mg via INTRAVENOUS

## 2013-12-09 MED ORDER — HYDROMORPHONE HCL PF 1 MG/ML IJ SOLN
0.5000 mg | INTRAMUSCULAR | Status: DC | PRN
  Administered 2013-12-09: 0.5 mg via INTRAVENOUS

## 2013-12-09 MED ORDER — ROPIVACAINE HCL 5 MG/ML IJ SOLN
INTRAMUSCULAR | Status: DC | PRN
Start: 2013-12-09 — End: 2013-12-09
  Administered 2013-12-09: 30 mL via PERINEURAL

## 2013-12-09 MED ORDER — FENTANYL CITRATE 0.05 MG/ML IJ SOLN
INTRAMUSCULAR | Status: DC | PRN
  Administered 2013-12-09 (×3): 50 ug via INTRAVENOUS
  Administered 2013-12-09: 100 ug via INTRAVENOUS
  Administered 2013-12-09: 50 ug via INTRAVENOUS

## 2013-12-09 MED ORDER — PROPOFOL 10 MG/ML IV EMUL
INTRAVENOUS | Status: AC
  Filled 2013-12-09: qty 50

## 2013-12-09 MED ORDER — SODIUM CHLORIDE 0.9 % IJ SOLN
INTRAMUSCULAR | Status: AC
  Filled 2013-12-09: qty 10

## 2013-12-09 MED ORDER — PROMETHAZINE HCL 25 MG/ML IJ SOLN
6.2500 mg | INTRAMUSCULAR | Status: DC | PRN
  Administered 2013-12-09: 6.25 mg via INTRAVENOUS

## 2013-12-09 MED ORDER — OXYCODONE HCL 5 MG/5ML PO SOLN: 5.0000 mg | Freq: Once | ORAL | Status: DC | PRN

## 2013-12-09 MED ORDER — ONDANSETRON HCL 4 MG/2ML IJ SOLN
INTRAMUSCULAR | Status: DC | PRN
  Administered 2013-12-09: 4 mg via INTRAVENOUS

## 2013-12-09 MED ORDER — BUPIVACAINE-EPINEPHRINE 0.5% -1:200000 IJ SOLN
INTRAMUSCULAR | Status: DC | PRN
  Administered 2013-12-09: 10 mL

## 2013-12-09 MED ORDER — LACTATED RINGERS IV SOLN: INTRAVENOUS | Status: DC

## 2013-12-09 MED ORDER — FENTANYL CITRATE 0.05 MG/ML IJ SOLN: 50.0000 ug | INTRAMUSCULAR | Status: DC | PRN

## 2013-12-09 MED ORDER — BUPIVACAINE HCL (PF) 0.5 % IJ SOLN
INTRAMUSCULAR | Status: AC
Start: 2013-12-09 — End: 2013-12-09
  Filled 2013-12-09: qty 30

## 2013-12-09 MED ORDER — DEXAMETHASONE SODIUM PHOSPHATE 10 MG/ML IJ SOLN
INTRAMUSCULAR | Status: DC | PRN
  Administered 2013-12-09: 8 mg via INTRAVENOUS

## 2013-12-09 MED ORDER — MIDAZOLAM HCL 2 MG/ML PO SYRP: 12.0000 mg | ORAL_SOLUTION | Freq: Once | ORAL | Status: DC | PRN

## 2013-12-09 SURGICAL SUPPLY — 70 items
BLADE MINI RND TIP GREEN BEAV (BLADE) IMPLANT
BLADE SURG 15 STRL LF DISP TIS (BLADE) ×1 IMPLANT
BLADE SURG 15 STRL SS (BLADE) ×3
BNDG CMPR 9X4 STRL LF SNTH (GAUZE/BANDAGES/DRESSINGS) ×1
BNDG COHESIVE 3X5 TAN STRL LF (GAUZE/BANDAGES/DRESSINGS) ×3 IMPLANT
BNDG ESMARK 4X9 LF (GAUZE/BANDAGES/DRESSINGS) ×2 IMPLANT
BNDG GAUZE ELAST 4 BULKY (GAUZE/BANDAGES/DRESSINGS) ×6 IMPLANT
CHLORAPREP W/TINT 26ML (MISCELLANEOUS) ×3 IMPLANT
CORDS BIPOLAR (ELECTRODE) ×3 IMPLANT
COVER MAYO STAND STRL (DRAPES) ×3 IMPLANT
COVER TABLE BACK 60X90 (DRAPES) ×3 IMPLANT
CUFF TOURNIQUET SINGLE 18IN (TOURNIQUET CUFF) ×2 IMPLANT
DECANTER SPIKE VIAL GLASS SM (MISCELLANEOUS) IMPLANT
DRAPE EXTREMITY T 121X128X90 (DRAPE) ×3 IMPLANT
DRAPE SURG 17X23 STRL (DRAPES) ×3 IMPLANT
DRSG EMULSION OIL 3X3 NADH (GAUZE/BANDAGES/DRESSINGS) ×3 IMPLANT
ELECT REM PT RETURN 9FT ADLT (ELECTROSURGICAL)
ELECTRODE REM PT RTRN 9FT ADLT (ELECTROSURGICAL) IMPLANT
GAUZE SPONGE 4X4 12PLY STRL (GAUZE/BANDAGES/DRESSINGS) ×3 IMPLANT
GLOVE BIO SURGEON STRL SZ7.5 (GLOVE) ×3 IMPLANT
GLOVE BIOGEL PI IND STRL 7.0 (GLOVE) IMPLANT
GLOVE BIOGEL PI IND STRL 8 (GLOVE) ×1 IMPLANT
GLOVE BIOGEL PI INDICATOR 7.0 (GLOVE) ×4
GLOVE BIOGEL PI INDICATOR 8 (GLOVE) ×2
GLOVE ECLIPSE 6.5 STRL STRAW (GLOVE) ×2 IMPLANT
GOWN STRL REUS W/ TWL LRG LVL3 (GOWN DISPOSABLE) ×1 IMPLANT
GOWN STRL REUS W/TWL LRG LVL3 (GOWN DISPOSABLE) ×3
GOWN STRL REUS W/TWL XL LVL3 (GOWN DISPOSABLE) ×3 IMPLANT
LOOP VESSEL MAXI BLUE (MISCELLANEOUS) IMPLANT
LOOP VESSEL MINI RED (MISCELLANEOUS) IMPLANT
NDL HYPO 25X1 1.5 SAFETY (NEEDLE) IMPLANT
NEEDLE HYPO 25X1 1.5 SAFETY (NEEDLE) ×3 IMPLANT
NS IRRIG 1000ML POUR BTL (IV SOLUTION) ×3 IMPLANT
PACK BASIN DAY SURGERY FS (CUSTOM PROCEDURE TRAY) ×3 IMPLANT
PADDING CAST ABS 3INX4YD NS (CAST SUPPLIES)
PADDING CAST ABS 4INX4YD NS (CAST SUPPLIES) ×2
PADDING CAST ABS COTTON 3X4 (CAST SUPPLIES) IMPLANT
PADDING CAST ABS COTTON 4X4 ST (CAST SUPPLIES) ×1 IMPLANT
PENCIL BUTTON HOLSTER BLD 10FT (ELECTRODE) IMPLANT
SLEEVE SCD COMPRESS KNEE MED (MISCELLANEOUS) ×2 IMPLANT
SLING ARM LRG ADULT FOAM STRAP (SOFTGOODS) IMPLANT
SPEAR EYE SURG WECK-CEL (MISCELLANEOUS) ×3 IMPLANT
SPLINT FAST PLASTER 5X30 (CAST SUPPLIES)
SPLINT PLASTER CAST FAST 5X30 (CAST SUPPLIES) IMPLANT
SPLINT PLASTER CAST XFAST 3X15 (CAST SUPPLIES) IMPLANT
SPLINT PLASTER XTRA FASTSET 3X (CAST SUPPLIES)
STOCKINETTE 6  STRL (DRAPES) ×2
STOCKINETTE 6 STRL (DRAPES) ×1 IMPLANT
SUT ETHIBOND 2-0 V-5 NDL (SUTURE) IMPLANT
SUT ETHIBOND 2-0 V-5 NEEDLE (SUTURE) ×12 IMPLANT
SUT ETHIBOND 3-0 V-5 (SUTURE) ×3 IMPLANT
SUT ETHILON 8 0 BV130 4 (SUTURE) ×3 IMPLANT
SUT ETHILON 9 0 V 100.4 (SUTURE) ×1 IMPLANT
SUT FIBERWIRE 2-0 18 17.9 3/8 (SUTURE)
SUT PROLENE 6 0 P 1 18 (SUTURE) ×1 IMPLANT
SUT SILK 4 0 PS 2 (SUTURE) IMPLANT
SUT STEEL 4 (SUTURE) ×3 IMPLANT
SUT SUPRAMID 3-0 (SUTURE) IMPLANT
SUT SUPRAMID 4-0 (SUTURE) IMPLANT
SUT TICRON 4 0 30 3186 31 (SUTURE) IMPLANT
SUT VIC AB 3-0 FS2 27 (SUTURE) ×2 IMPLANT
SUT VICRYL RAPIDE 4-0 (SUTURE) IMPLANT
SUT VICRYL RAPIDE 4/0 PS 2 (SUTURE) ×3 IMPLANT
SUTURE FIBERWR 2-0 18 17.9 3/8 (SUTURE) IMPLANT
SYR BULB 3OZ (MISCELLANEOUS) ×3 IMPLANT
SYRINGE 10CC LL (SYRINGE) ×2 IMPLANT
TOWEL OR 17X24 6PK STRL BLUE (TOWEL DISPOSABLE) ×3 IMPLANT
TUBE CONNECTING 20'X1/4 (TUBING)
TUBE CONNECTING 20X1/4 (TUBING) IMPLANT
UNDERPAD 30X30 INCONTINENT (UNDERPADS AND DIAPERS) ×3 IMPLANT

## 2013-12-09 NOTE — Anesthesia Procedure Notes (Addendum)
Procedure Name: LMA Insertion Date/Time: 12/09/2013 10:48 AM Performed by: Curly [place] Pre-anesthesia Checklist: Patient identified, Emergency Drugs available, Suction available and Patient being monitored Patient Re-evaluated:Patient Re-evaluated prior to inductionOxygen Delivery Method: Circle System Utilized Preoxygenation: Pre-oxygenation with 100% oxygen Intubation Type: IV induction Ventilation: Mask ventilation without difficulty LMA: LMA inserted LMA Size: 4.0 Number of attempts: 1 Airway Equipment and Method: bite block Placement Confirmation: positive ETCO2 and breath sounds checked- equal and bilateral Tube secured with: Tape Dental Injury: Teeth and Oropharynx as per pre-operative assessment     Anesthesia Regional Block:  Supraclavicular block  Pre-Anesthetic Checklist: ,, timeout performed, Correct Patient, Correct Site, Correct Laterality, Correct Procedure, Correct Position, site marked, Risks and benefits discussed,  Surgical consent,  Pre-op evaluation,  At surgeon's request and post-op pain management  Laterality: Upper and Left  Prep: chloraprep       Needles:  Injection technique: Single-shot  Needle Type: Echogenic Needle          Additional Needles:  Procedures: ultrasound guided (picture in chart) Supraclavicular block Narrative:  Start time: 12/09/2013 2:08 PM End time: 12/09/2013 2:16 PM Injection made incrementally with aspirations every 5 mL.  Performed by: Personally  Anesthesiologist: Moser  Additional Notes: H+P and labs reviewed, risks and benefits discussed with patient, procedure tolerated well without complications

## 2013-12-09 NOTE — Interval H&P Note (Signed)
History and Physical Interval Note:  12/09/2013 10:35 AM  Robin Thornton  has presented today for surgery, with the diagnosis of LEFT FOREARM LACERATION WITH FLEXOR TENDOR LACERATION AND ULNA NERVE LACERATION  The various methods of treatment have been discussed with the patient and family. After consideration of risks, benefits and other options for treatment, the patient has consented to  Procedure(s): LEFT FOREARM WOUND EXPLORATION WTH REPAIR OF TENDON AND NERVE (Left) as a surgical intervention .  The patient's history has been reviewed, patient examined, no change in status, stable for surgery.  I have reviewed the patient's chart and labs.  Questions were answered to the patient's satisfaction.     Cristy Colmenares A.

## 2013-12-09 NOTE — Discharge Instructions (Signed)
Discharge Instructions ° ° °You have a dressing with a plaster splint incorporated in it. °Move your fingers as much as possible, making a full fist and fully opening the fist. °Elevate your hand to reduce pain & swelling of the digits.  Ice over the operative site may be helpful to reduce pain & swelling.  DO NOT USE HEAT. °Pain medicine has been prescribed for you.  °Use your medicine as needed over the first 48 hours, and then you can begin to taper your use.  You may use Tylenol in place of your prescribed pain medication, but not IN ADDITION to it. °Leave the dressing in place until you return to our office.  °You may shower, but keep the bandage clean & dry.  °You may drive a car when you are off of prescription pain medications and can safely control your vehicle with both hands. °Our office will call you to arrange follow-up ° ° °Please call [phone] during normal business hours or [phone] after hours for any problems. Including the following: ° °- excessive redness of the incisions °- drainage for more than 4 days °- fever of more than 101.5 F ° °*Please note that pain medications will not be refilled after hours or on weekends. ° ° °Post Anesthesia Home Care Instructions ° °Activity: °Get plenty of rest for the remainder of the day. A responsible adult should stay with you for 24 hours following the procedure.  °For the next 24 hours, DO NOT: °-Drive a car °-Operate machinery °-Drink alcoholic beverages °-Take any medication unless instructed by your physician °-Make any legal decisions or sign important papers. ° °Meals: °Start with liquid foods such as gelatin or soup. Progress to regular foods as tolerated. Avoid greasy, spicy, heavy foods. If nausea and/or vomiting occur, drink only clear liquids until the nausea and/or vomiting subsides. Call your physician if vomiting continues. ° °Special Instructions/Symptoms: °Your throat may feel dry or sore from the anesthesia or the breathing tube  placed in your throat during surgery. If this causes discomfort, gargle with warm salt water. The discomfort should disappear within 24 hours. ° °

## 2013-12-09 NOTE — Op Note (Signed)
12/09/2013  10:36 AM  PATIENT:  Robin Thornton  28 y.o. female  PRE-OPERATIVE DIAGNOSIS:  Deep left volar forearm laceration with suspected injury to FCU and ulnar nerve  POST-OPERATIVE DIAGNOSIS:  Deep left volar forearm laceration with transection of FCU, FCR, FDS to ring finger, and ulnar artery  PROCEDURE:   1. Exploration of deep traumatic wound of left forearm    2. Repair of left ulnar artery    3. Repair of left forearm tendons x3 (FCU, FCR, FDS #3)    4. Complex repair of the laceration of skin, left forearm, 13 cm  SURGEON: Cliffton Asters. Janee Morn, MD  PHYSICIAN ASSISTANT: None  ANESTHESIA:  general  SPECIMENS:  None  DRAINS:   None  PREOPERATIVE INDICATIONS:  Robin Thornton is a  28 y.o. female with deep left volar forearm laceration and suspected injury to the FCU and ulnar nerve.  The risks benefits and alternatives were discussed with the patient preoperatively including but not limited to the risks of infection, bleeding, nerve injury, cardiopulmonary complications, the need for revision surgery, among others, and the patient verbalized understanding and consented to proceed.  OPERATIVE IMPLANTS: None  OPERATIVE PROCEDURE:  After receiving prophylactic antibiotics, the patient was escorted to the operative theatre and placed in a supine position. General anesthesia was administered A surgical "time-out" was performed during which the planned procedure, proposed operative site, and the correct patient identity were compared to the operative consent and agreement confirmed by the circulating nurse according to current facility policy.  Following application of a tourniquet to the operative extremity, the exposed skin was pre-scrubbed with a Hibiclens scrub brush before being formally prepped with Chloraprep and draped in the usual sterile fashion.  The limb was exsanguinated with an Esmarch bandage and the tourniquet inflated to approximately higher than systolic BP.  The  previous sutures that had been placed and the wounds were removed. The wound was opened, and the edges cleaned with scraping dissection with a sponge. The wound is copiously irrigated and exploration commenced. Ultimately was determined that the transected tendons were as listed above. The ulnar artery was injured but not fully transected. The ulnar nerve appeared intact.  Attention was directed towards preparation for the tendon repair were the ends were isolated and 2-0 Ethibond suture placed in a modified Kessler pattern with locking/grasping sutures. The repair it was to be too strand core repair for each tendon. Once the sutures were placed in the appropriate and together in a hemostat, the neurovascular bundle was further explored. The ulnar artery was found to have incomplete injury to it, and the nerve was carefully dissected over the entire zone of injury to ensure that there was no discontinuity of the nerve. Attention was then directed to call artery repair with standard microsurgical techniques. Tourniquet was released, and a section of artery excised back to what appeared to be healthy artery and both hands. This was dilated, and bleeding from each end was brisk and pulsatile  Using 8-0 nylon, interrupted sutures were placed in standard fashion, and patency tests following repair indicated flow across the anastomosis in both directions.  Attention was then directed back to the tendon repairs which were completed tying the core sutures, followed by 3-0 Vicryl figure-of-eight suture and each tendon as well. The FDS to the ring was transected quite proximally coming across the tendon junction, and so there were a few set aside 3-0 Vicryl sutures placed between the tendon of the ring finger and the  adjacent ones to help offload the remaining repair. The skin was then reapproximated, some of the skin edges trimmed, and closed with interrupted 3-0 Vicryl deep dermal buried sutures and running 4-0 Vicryl  Rapide horizontal mattress suture. Half percent Marcaine with epinephrine was instilled around the skin edges for postoperative pain control and a postop splint dressing was applied with the wrist in neutral, MP joints flexed and a dorsal plaster component. She was awakened and taken to the recovery room in stable condition, breathing spontaneously.  DISPOSITION: She'll be discharged home today with typical postop instructions, returning in 5-10 days to current therapy for a dorsal splint, placing the wrist in neutral and MP joints flexed in 10-15 days from now and our office for a wound check and splint check.

## 2013-12-09 NOTE — Anesthesia Preprocedure Evaluation (Signed)
Anesthesia Evaluation  Patient identified by MRN, date of birth, ID band Patient awake    Reviewed: Allergy & Precautions, H&P , NPO status , Patient's Chart, lab work & pertinent test results  History of Anesthesia Complications Negative for: history of anesthetic complications  Airway Mallampati: II TM Distance: >3 FB Neck ROM: Full    Dental   Pulmonary neg sleep apnea, neg COPDneg recent URI, Current Smoker,  breath sounds clear to auscultation        Cardiovascular negative cardio ROS  Rhythm:Regular     Neuro/Psych  Headaches, negative psych ROS   GI/Hepatic negative GI ROS, Neg liver ROS,   Endo/Other  negative endocrine ROS  Renal/GU negative Renal ROS     Musculoskeletal   Abdominal   Peds  Hematology negative hematology ROS (+)   Anesthesia Other Findings   Reproductive/Obstetrics                           Anesthesia Physical Anesthesia Plan  ASA: II  Anesthesia Plan: General   Post-op Pain Management:    Induction: Intravenous  Airway Management Planned: LMA  Additional Equipment: None  Intra-op Plan:   Post-operative Plan: Extubation in OR  Informed Consent: I have reviewed the patients History and Physical, chart, labs and discussed the procedure including the risks, benefits and alternatives for the proposed anesthesia with the patient or authorized representative who has indicated his/her understanding and acceptance.   Dental advisory given  Plan Discussed with: CRNA and Surgeon  Anesthesia Plan Comments:         Anesthesia Quick Evaluation

## 2013-12-09 NOTE — Anesthesia Postprocedure Evaluation (Signed)
  Anesthesia Post-op Note  Patient: Robin Thornton  Procedure(s) Performed: Procedure(s): LEFT FOREARM WOUND EXPLORATION WTH REPAIR OF TENDON AND NERVE (Left)  Patient Location: PACU  Anesthesia Type:General and Regional  Level of Consciousness: awake and alert   Airway and Oxygen Therapy: Patient Spontanous Breathing  Post-op Pain: none  Post-op Assessment: Post-op Vital signs reviewed, Patient's Cardiovascular Status Stable, Respiratory Function Stable, Patent Airway, No signs of Nausea or vomiting and Pain level controlled  Post-op Vital Signs: Reviewed and stable  Last Vitals:  Filed Vitals:   12/09/13 1440  BP: 113/69  Pulse: 82  Temp: 36.7 C  Resp: 16    Complications: No apparent anesthesia complications

## 2013-12-09 NOTE — Progress Notes (Signed)
Assisted Dr. Maple Hudson with left, ultrasound guided, supraclavicular block. Side rails up, monitors on throughout procedure. See vital signs in flow sheet. Tolerated Procedure well. Sharyne Richters CRNA in to assist.

## 2013-12-09 NOTE — Transfer of Care (Signed)
Immediate Anesthesia Transfer of Care Note  Patient: Robin Thornton  Procedure(s) Performed: Procedure(s): LEFT FOREARM WOUND EXPLORATION WTH REPAIR OF TENDON AND NERVE (Left)  Patient Location: PACU  Anesthesia Type:General  Level of Consciousness: awake, alert  and oriented  Airway & Oxygen Therapy: Patient Spontanous Breathing and Patient connected to face mask oxygen  Post-op Assessment: Report given to PACU RN, Post -op Vital signs reviewed and stable and Patient moving all extremities  Post vital signs: Reviewed and stable  Complications: No apparent anesthesia complications

## 2013-12-12 NOTE — ED Provider Notes (Signed)
Medical screening examination/treatment/procedure(s) were performed by non-physician practitioner and as supervising physician I was immediately available for consultation/collaboration.   EKG Interpretation None       Elis Sauber R. Nishan Ovens, MD 12/12/13 1440 

## 2013-12-13 ENCOUNTER — Encounter (HOSPITAL_BASED_OUTPATIENT_CLINIC_OR_DEPARTMENT_OTHER): Payer: Self-pay | Admitting: Orthopedic Surgery

## 2013-12-13 NOTE — Addendum Note (Signed)
Addendum created 12/13/13 5409 by Tonette Bihari   Modules edited: Anesthesia Blocks and Procedures, Charges VN, Clinical Notes   Clinical Notes:  File: [id number]

## 2013-12-15 ENCOUNTER — Ambulatory Visit: Payer: Medicaid Other | Attending: Orthopedic Surgery | Admitting: Occupational Therapy

## 2013-12-15 DIAGNOSIS — M25649 Stiffness of unspecified hand, not elsewhere classified: Secondary | ICD-10-CM | POA: Insufficient documentation

## 2013-12-15 DIAGNOSIS — M25549 Pain in joints of unspecified hand: Secondary | ICD-10-CM | POA: Insufficient documentation

## 2013-12-15 DIAGNOSIS — IMO0001 Reserved for inherently not codable concepts without codable children: Secondary | ICD-10-CM | POA: Diagnosis present

## 2013-12-22 ENCOUNTER — Ambulatory Visit: Payer: Medicaid Other | Admitting: Occupational Therapy

## 2014-01-02 ENCOUNTER — Telehealth: Payer: Self-pay | Admitting: Internal Medicine

## 2014-01-02 NOTE — Telephone Encounter (Signed)
[facility] called asking for an NPI for pt to be seen. Pt knows she is NOT a pt here and She states she is in the processing of getting it changed. I gave a one time group NPI approval

## 2014-01-09 ENCOUNTER — Ambulatory Visit: Payer: Medicaid Other | Admitting: Occupational Therapy

## 2014-09-16 IMAGING — CT CT ABD-PELV W/ CM
2 of 4 series · 16 of 46 positions shown, 18 images · IV contrast (APPLIED)
Comparison: None.

CLINICAL DATA: Generalized abdominal and low back pain. Dysuria for
3 days.

EXAM:
CT ABDOMEN AND PELVIS WITH CONTRAST
TECHNIQUE: Multidetector CT imaging of the abdomen and pelvis was performed
using the standard protocol following bolus administration of
intravenous contrast.
CONTRAST:  100mL OMNIPAQUE IOHEXOL 300 MG/ML  SOLN

[Series 2: abd/pelvis 5.0 b31f · axial · 0.74mm/px · z∈[-460,-40]mm · 13 of 92 slices shown, 15 images]
[im 4/92  soft-tissue]
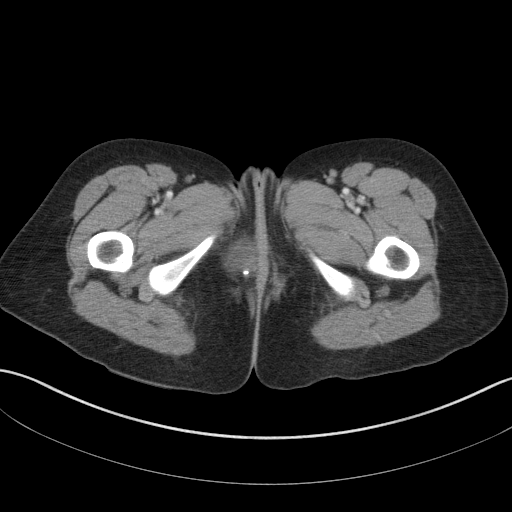
[im 4/92  bone]
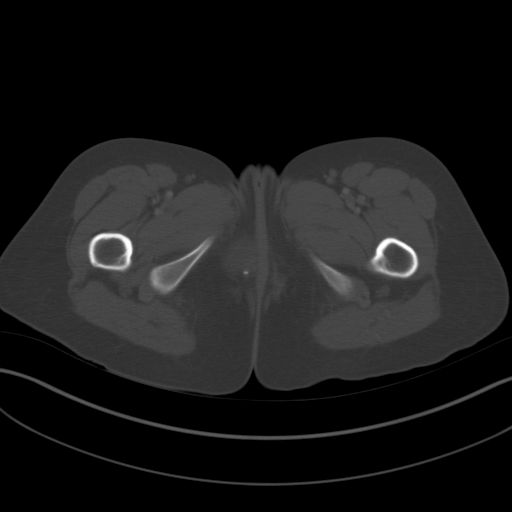
[im 12/92  soft-tissue]
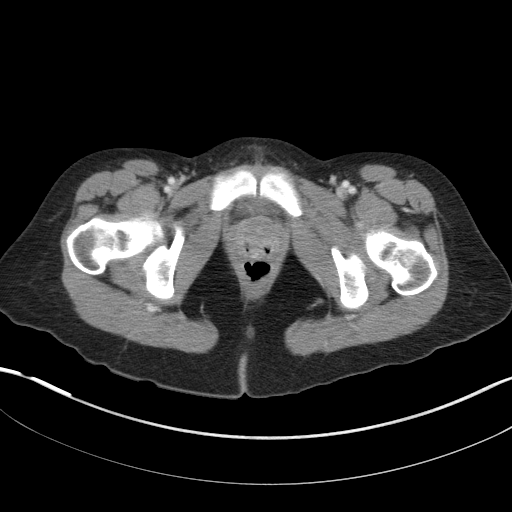
[im 19/92  soft-tissue]
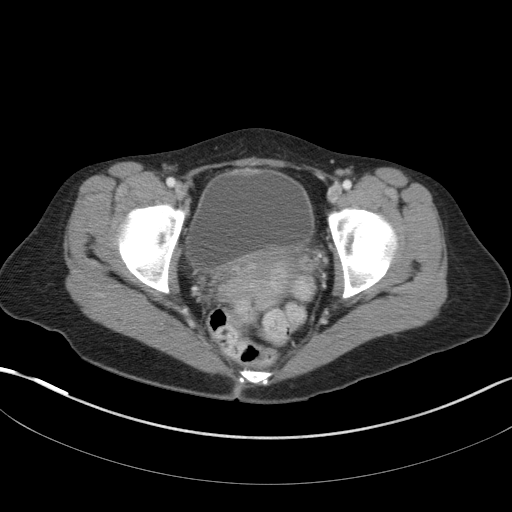
[im 27/92  soft-tissue]
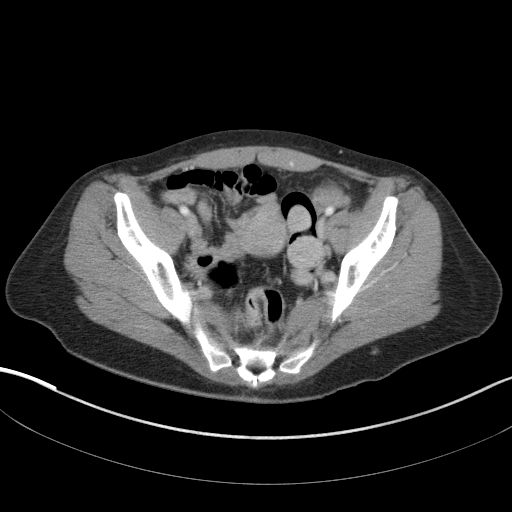
[im 31/92  soft-tissue]
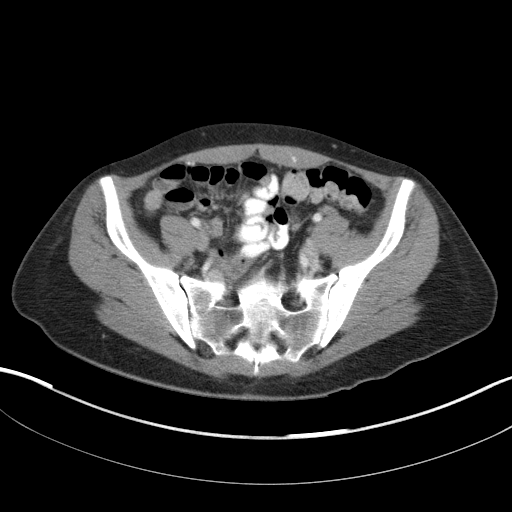
[im 38/92  soft-tissue]
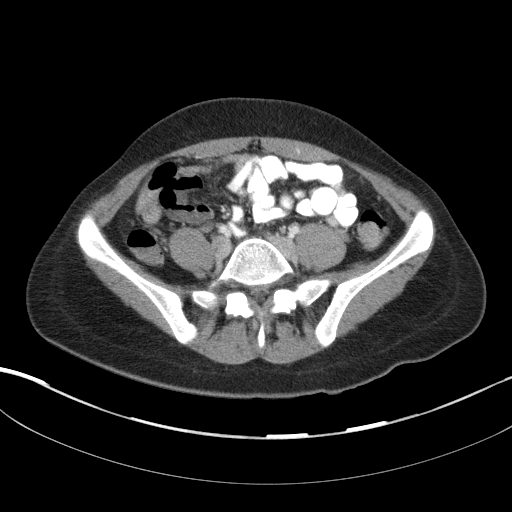
[im 46/92  soft-tissue]
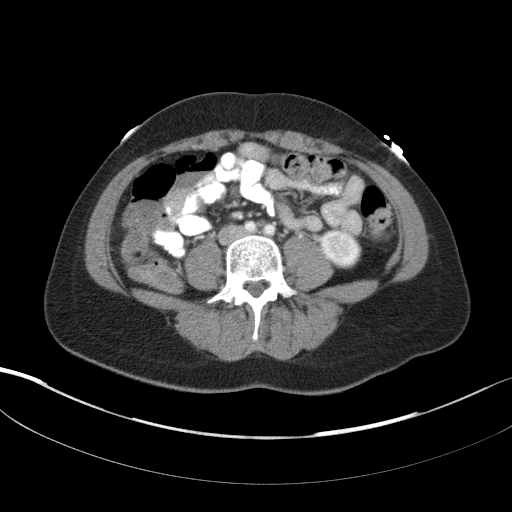
[im 54/92  soft-tissue]
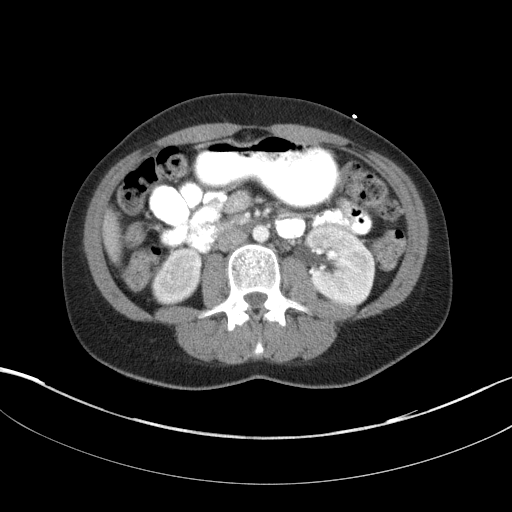
[im 61/92  soft-tissue]
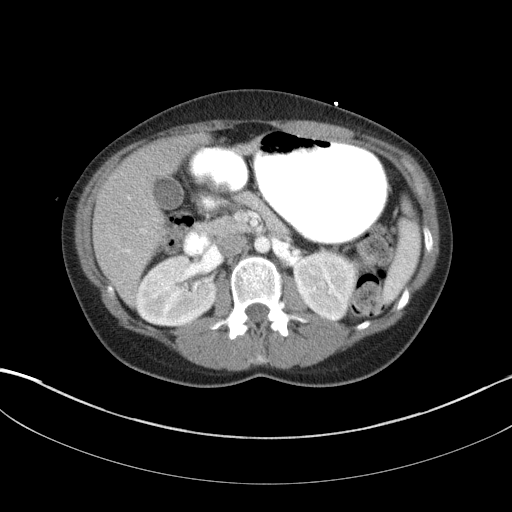
[im 61/92  bone]
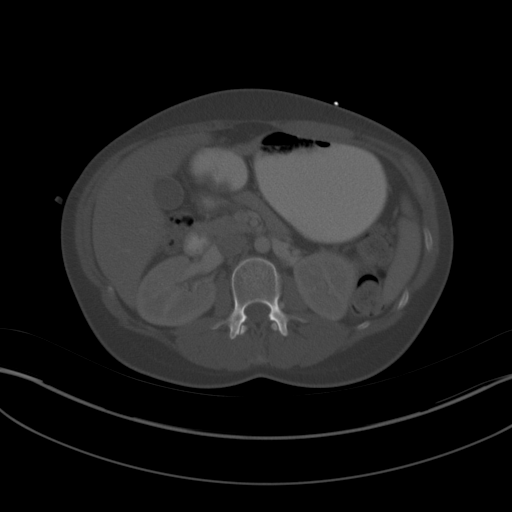
[im 65/92  soft-tissue]
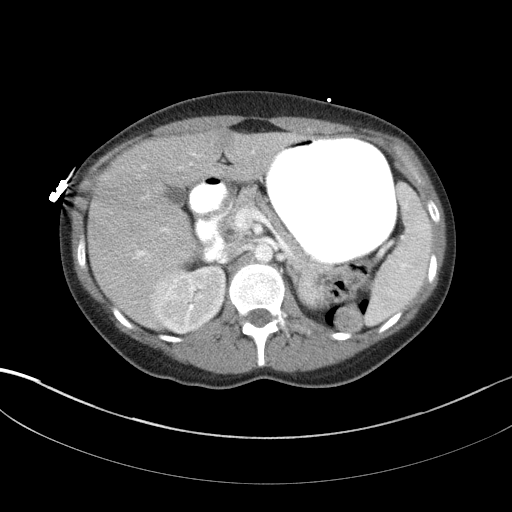
[im 73/92  soft-tissue]
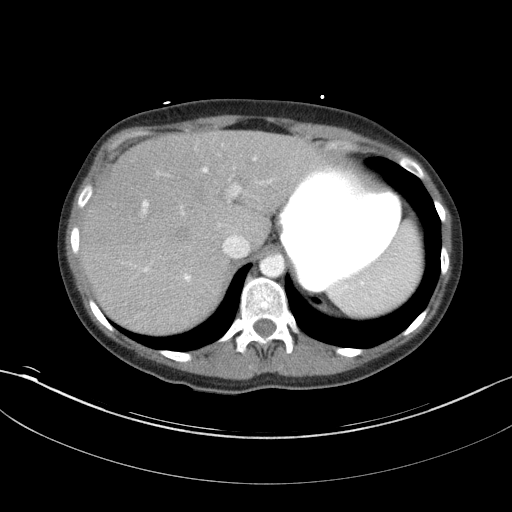
[im 80/92  soft-tissue]
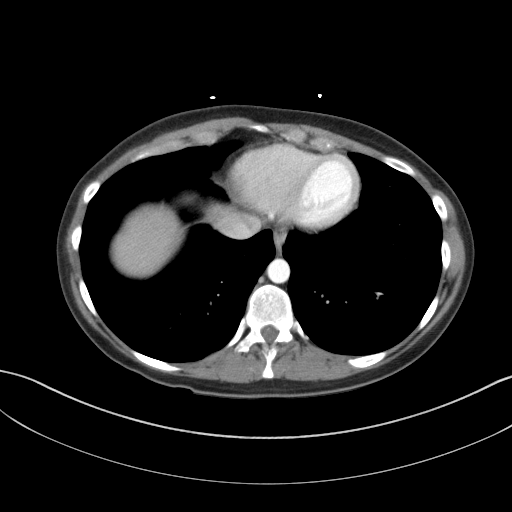
[im 88/92  soft-tissue]
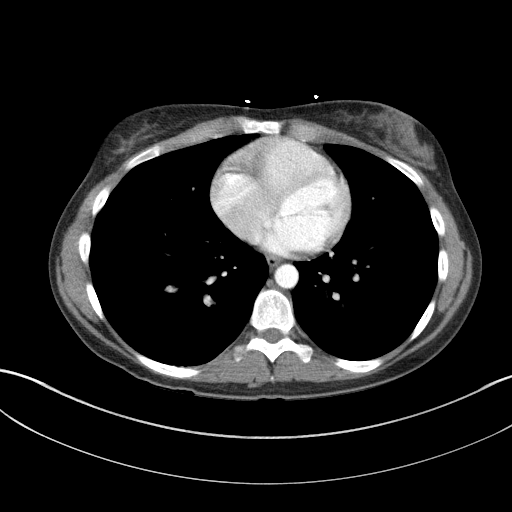

[Series 5: abd/pelvis 3.0 coronal · coronal · 0.84mm/px · 3 of 75 slices shown]
[im 25/75  soft-tissue]
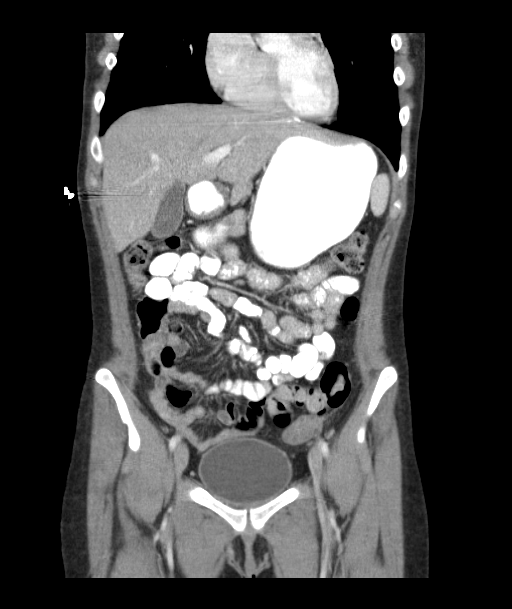
[im 33/75  soft-tissue]
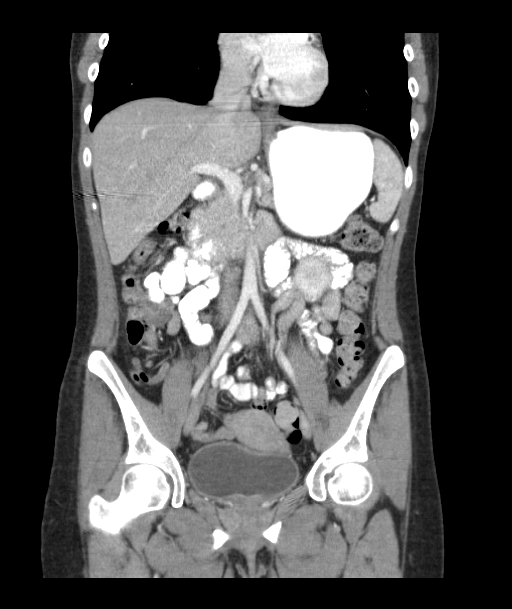
[im 42/75  soft-tissue]
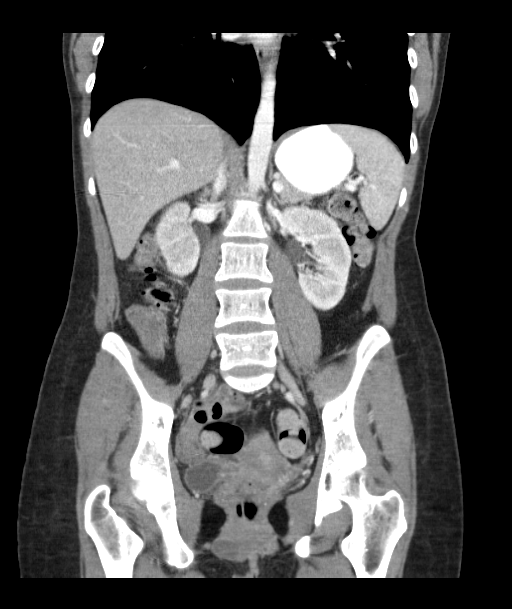

[16 of 46 positions shown; findings below may reference images not displayed]

FINDINGS: Lung bases: Clear lung bases. Normal heart size without pericardial
or pleural effusion.

Abdomen/Pelvis: Normal liver, spleen, stomach, pancreas,
gallbladder, biliary tract, adrenal glands. Suspect ill definition
of cortico-medullary differentiation within both kidneys. Example
image 35/series 2. No retroperitoneal or retrocrural adenopathy.
Colonic stool burden suggests constipation. Normal terminal ileum
and appendix. Normal small bowel without abdominal ascites. No
pelvic adenopathy. Normal urinary bladder and uterus, without
adnexal mass or significant free pelvic fluid.

Soft tissue fullness about the right side of the perineum/labia.
x 3.9 cm on image 88/series 2

Bones/Musculoskeletal: No acute osseous abnormality.
IMPRESSION: 1.  Possible constipation.
2. Poor cortical medullary differentiation within the kidneys.
Cannot exclude pyelonephritis. Correlate with urinalysis.
3. Soft tissue fullness within the right side of the labia/perineum.
Indeterminate. Recommend PHYSICAL EXAMINATION correlation.
Considerations include Bartholin's gland cyst.

## 2017-08-01 ENCOUNTER — Other Ambulatory Visit: Payer: Self-pay

## 2017-08-01 ENCOUNTER — Encounter (HOSPITAL_COMMUNITY): Payer: Self-pay | Admitting: Emergency Medicine

## 2017-08-01 ENCOUNTER — Emergency Department (HOSPITAL_COMMUNITY)
Admission: EM | Admit: 2017-08-01 | Discharge: 2017-08-01 | Disposition: A | Discharge status: Home / Self Care | Payer: Self-pay | Attending: Emergency Medicine | Admitting: Emergency Medicine

## 2017-08-01 DIAGNOSIS — F909 Attention-deficit hyperactivity disorder, unspecified type: Secondary | ICD-10-CM | POA: Insufficient documentation

## 2017-08-01 DIAGNOSIS — F17210 Nicotine dependence, cigarettes, uncomplicated: Secondary | ICD-10-CM | POA: Insufficient documentation

## 2017-08-01 DIAGNOSIS — F1124 Opioid dependence with opioid-induced mood disorder: Secondary | ICD-10-CM | POA: Insufficient documentation

## 2017-08-01 DIAGNOSIS — F1910 Other psychoactive substance abuse, uncomplicated: Secondary | ICD-10-CM | POA: Insufficient documentation

## 2017-08-01 DIAGNOSIS — Z79899 Other long term (current) drug therapy: Secondary | ICD-10-CM | POA: Insufficient documentation

## 2017-08-01 LAB — CBC
HCT: 40.5 % (ref 36.0–46.0)
Hemoglobin: 13.3 g/dL (ref 12.0–15.0)
MCH: 29.4 pg (ref 26.0–34.0)
MCHC: 32.8 g/dL (ref 30.0–36.0)
MCV: 89.6 fL (ref 78.0–100.0)
PLATELETS: 316 10*3/uL (ref 150–400)
RBC: 4.52 MIL/uL (ref 3.87–5.11)
RDW: 13.8 % (ref 11.5–15.5)
WBC: 5.4 10*3/uL (ref 4.0–10.5)

## 2017-08-01 LAB — COMPREHENSIVE METABOLIC PANEL
ALBUMIN: 3.9 g/dL (ref 3.5–5.0)
ALK PHOS: 81 U/L (ref 38–126)
ALT: 39 U/L (ref 14–54)
ANION GAP: 8 (ref 5–15)
AST: 38 U/L (ref 15–41)
BILIRUBIN TOTAL: 0.6 mg/dL (ref 0.3–1.2)
BUN: 10 mg/dL (ref 6–20)
CALCIUM: 8.7 mg/dL — AB (ref 8.9–10.3)
CO2: 28 mmol/L (ref 22–32)
Chloride: 100 mmol/L — ABNORMAL LOW (ref 101–111)
Creatinine, Ser: 0.77 mg/dL (ref 0.44–1.00)
GFR calc non Af Amer: >60 mL/min (ref >60)
GLUCOSE: 86 mg/dL (ref 65–99)
Potassium: 4 mmol/L (ref 3.5–5.1)
Sodium: 136 mmol/L (ref 135–145)
TOTAL PROTEIN: 7.2 g/dL (ref 6.5–8.1)

## 2017-08-01 LAB — RAPID URINE DRUG SCREEN, HOSP PERFORMED
AMPHETAMINES: POSITIVE — AB
Barbiturates: NOT DETECTED
Benzodiazepines: POSITIVE — AB
Cocaine: NOT DETECTED
OPIATES: POSITIVE — AB
Tetrahydrocannabinol: NOT DETECTED

## 2017-08-01 LAB — PREGNANCY, URINE: Preg Test, Ur: NEGATIVE

## 2017-08-01 LAB — ETHANOL: Alcohol, Ethyl (B): <10 mg/dL (ref <10)

## 2017-08-01 MED ORDER — PROMETHAZINE HCL 25 MG PO TABS: 25.0000 mg | ORAL_TABLET | Freq: Four times a day (QID) | ORAL | 0 refills | Status: AC | PRN

## 2017-08-01 MED ORDER — NICOTINE 21 MG/24HR TD PT24: 21.0000 mg | MEDICATED_PATCH | Freq: Every day | TRANSDERMAL | Status: DC

## 2017-08-01 MED ORDER — CYCLOBENZAPRINE HCL 10 MG PO TABS: 10.0000 mg | ORAL_TABLET | Freq: Two times a day (BID) | ORAL | 0 refills | Status: AC | PRN

## 2017-08-01 MED ORDER — DICYCLOMINE HCL 20 MG PO TABS: 20.0000 mg | ORAL_TABLET | Freq: Two times a day (BID) | ORAL | 0 refills | Status: AC

## 2017-08-01 MED ORDER — IBUPROFEN 200 MG PO TABS: 600.0000 mg | ORAL_TABLET | Freq: Three times a day (TID) | ORAL | Status: DC | PRN

## 2017-08-01 MED ORDER — ALUM & MAG HYDROXIDE-SIMETH 200-200-20 MG/5ML PO SUSP: 30.0000 mL | Freq: Four times a day (QID) | ORAL | Status: DC | PRN

## 2017-08-01 NOTE — Consult Note (Signed)
  Tele Assessment   Robin Thornton, 32 y.o., female patient presented to [facility] requesting assistance with detox and rehab services.  Patient seen via telepsych by TTS and this provider; chart reviewed and consulted with Dr. Lucianne Muss on 08/01/17.  On evaluation Robin Thornton reports polysubstance use and homelessness.  Patient states that she is looking for assistance to get into a rehab facility.  States that she was living in a hotel with her boyfriend but he is getting ready to go to jail and she does not have anywhere to stay.  Patient denies suicide/self-harm/homicidal ideations, psychosis, paranoia.  Patient states that she is not kept in the hospital she would not have any way to go.  Patient informed that resources for [facility], and other community resources for outpatient services/substance use disorder/rehab services will be sent to her.  Patient also wanted to know if we would talk to her mother and let her mother know that she had been to the hospital and that she was trying to get help for her substance use disorder.  Recommendations:  Patient has been psychiatrically cleared.  Outpatient psychiatric services.  Social work to give [place] copy on [facility], outpatient psychiatric services, community resources for substance use disorder, and a list though short/long-term rehab facilities.  Disposition: No evidence of imminent risk to self or others at present.   Patient does not meet criteria for psychiatric inpatient admission.   For detailed note see TTS tele assessment note   Robin Enfield B. Torrance Frech, NP
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
## 2017-08-01 NOTE — ED Notes (Signed)
Pt moved to Trauma B for TTS consult
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
## 2017-08-01 NOTE — ED Triage Notes (Signed)
Pt. Stated, I decided to come off of all my drugs, Gabapentin,, XaNAX , SUBOXnE, SUB TX , ALCOHOL, seraquil, . Pt has no where tp gpo and wants to come off all of it. Has no place to live. Her boyfriend is in jail. I decided to come here and get help.

## 2017-08-01 NOTE — Progress Notes (Signed)
Patient psych cleared.  Pt resources faxed to ed.  Robin Thornton. Kaylyn Lim, MSW, LCSWA Disposition Clinical Social Work [phone] (cell) [phone] (office)

## 2017-08-01 NOTE — Discharge Instructions (Addendum)
Contact a health care provider if: You cannot take your medicines as told. Your symptoms get worse. You have trouble resisting the urge to use drugs or alcohol. Get help right away if: You have serious thoughts about hurting yourself or someone else. You have a relapse.

## 2017-08-01 NOTE — ED Notes (Signed)
Pt having 2nd TTS consult with NP
